# Patient Record
Sex: Male | Born: 1935 | Race: White | Hispanic: No | Marital: Married | Smoking: Never smoker
Health system: Southern US, Community
[De-identification: ages and names within clinical notes are randomized; demographics above are authoritative.]

## PROBLEM LIST (undated history)

## (undated) DIAGNOSIS — M1990 Unspecified osteoarthritis, unspecified site: Secondary | ICD-10-CM

## (undated) DIAGNOSIS — K219 Gastro-esophageal reflux disease without esophagitis: Secondary | ICD-10-CM

## (undated) DIAGNOSIS — S069X9A Unspecified intracranial injury with loss of consciousness of unspecified duration, initial encounter: Secondary | ICD-10-CM

## (undated) DIAGNOSIS — C801 Malignant (primary) neoplasm, unspecified: Secondary | ICD-10-CM

## (undated) DIAGNOSIS — I639 Cerebral infarction, unspecified: Secondary | ICD-10-CM

## (undated) HISTORY — DX: Gastro-esophageal reflux disease without esophagitis: K21.9

## (undated) HISTORY — DX: Unspecified intracranial injury with loss of consciousness of unspecified duration, initial encounter: S06.9X9A

## (undated) HISTORY — PX: FRACTURE SURGERY: SHX138

## (undated) HISTORY — DX: Malignant (primary) neoplasm, unspecified: C80.1

## (undated) HISTORY — PX: JOINT REPLACEMENT: SHX530

---

## 1982-03-29 HISTORY — PX: HIP PINNING: SHX1757

## 2002-03-29 DIAGNOSIS — C801 Malignant (primary) neoplasm, unspecified: Secondary | ICD-10-CM

## 2002-03-29 HISTORY — DX: Malignant (primary) neoplasm, unspecified: C80.1

## 2005-05-05 ENCOUNTER — Inpatient Hospital Stay: Payer: Self-pay | Admitting: Specialist

## 2005-05-05 ENCOUNTER — Other Ambulatory Visit: Payer: Self-pay

## 2005-05-08 ENCOUNTER — Emergency Department: Payer: Self-pay | Admitting: Emergency Medicine

## 2005-05-20 ENCOUNTER — Emergency Department: Payer: Self-pay | Admitting: Urology

## 2005-05-20 ENCOUNTER — Emergency Department: Payer: Self-pay | Admitting: Emergency Medicine

## 2005-05-21 ENCOUNTER — Other Ambulatory Visit: Payer: Self-pay

## 2005-05-21 ENCOUNTER — Ambulatory Visit: Payer: Self-pay | Admitting: Urology

## 2005-05-31 ENCOUNTER — Ambulatory Visit: Payer: Self-pay | Admitting: Urology

## 2005-09-23 ENCOUNTER — Ambulatory Visit: Payer: Self-pay | Admitting: Unknown Physician Specialty

## 2005-09-24 ENCOUNTER — Other Ambulatory Visit: Payer: Self-pay

## 2005-09-24 ENCOUNTER — Ambulatory Visit: Payer: Self-pay | Admitting: Unknown Physician Specialty

## 2005-09-27 ENCOUNTER — Ambulatory Visit: Payer: Self-pay | Admitting: Unknown Physician Specialty

## 2007-12-16 ENCOUNTER — Inpatient Hospital Stay: Payer: Self-pay | Admitting: Internal Medicine

## 2007-12-16 ENCOUNTER — Other Ambulatory Visit: Payer: Self-pay

## 2007-12-17 ENCOUNTER — Other Ambulatory Visit: Payer: Self-pay

## 2007-12-17 ENCOUNTER — Ambulatory Visit: Payer: Self-pay | Admitting: Internal Medicine

## 2011-03-10 ENCOUNTER — Emergency Department: Payer: Self-pay | Admitting: *Deleted

## 2012-03-29 HISTORY — PX: LEG SURGERY: SHX1003

## 2012-06-07 DIAGNOSIS — S069XAA Unspecified intracranial injury with loss of consciousness status unknown, initial encounter: Secondary | ICD-10-CM

## 2012-06-07 DIAGNOSIS — S069X9A Unspecified intracranial injury with loss of consciousness of unspecified duration, initial encounter: Secondary | ICD-10-CM

## 2012-06-07 HISTORY — DX: Unspecified intracranial injury with loss of consciousness of unspecified duration, initial encounter: S06.9X9A

## 2012-06-07 HISTORY — DX: Unspecified intracranial injury with loss of consciousness status unknown, initial encounter: S06.9XAA

## 2012-06-10 ENCOUNTER — Encounter: Payer: Self-pay | Admitting: General Surgery

## 2012-06-15 ENCOUNTER — Encounter: Payer: Self-pay | Admitting: Internal Medicine

## 2012-06-22 LAB — CBC WITH DIFFERENTIAL/PLATELET
Basophil #: 0.1 10*3/uL (ref 0.0–0.1)
Basophil %: 1 %
Eosinophil #: 0.6 10*3/uL (ref 0.0–0.7)
HGB: 8.2 g/dL — ABNORMAL LOW (ref 13.0–18.0)
Lymphocyte %: 21.7 %
MCHC: 32.4 g/dL (ref 32.0–36.0)
MCV: 85 fL (ref 80–100)
Monocyte #: 0.8 x10 3/mm (ref 0.2–1.0)
Neutrophil #: 7.5 10*3/uL — ABNORMAL HIGH (ref 1.4–6.5)
Neutrophil %: 65.7 %
Platelet: 623 10*3/uL — ABNORMAL HIGH (ref 150–440)
RBC: 2.98 10*6/uL — ABNORMAL LOW (ref 4.40–5.90)
RDW: 14.9 % — ABNORMAL HIGH (ref 11.5–14.5)

## 2012-06-22 LAB — COMPREHENSIVE METABOLIC PANEL
Alkaline Phosphatase: 197 U/L — ABNORMAL HIGH (ref 50–136)
Bilirubin,Total: 0.3 mg/dL (ref 0.2–1.0)
Calcium, Total: 8.4 mg/dL — ABNORMAL LOW (ref 8.5–10.1)
Creatinine: 1.58 mg/dL — ABNORMAL HIGH (ref 0.60–1.30)
EGFR (Non-African Amer.): 40 — ABNORMAL LOW
Glucose: 89 mg/dL (ref 65–99)
Osmolality: 286 (ref 275–301)
Potassium: 5 mmol/L (ref 3.5–5.1)
SGPT (ALT): 93 U/L — ABNORMAL HIGH (ref 12–78)
Sodium: 138 mmol/L (ref 136–145)
Total Protein: 6.8 g/dL (ref 6.4–8.2)

## 2012-06-23 LAB — CBC WITH DIFFERENTIAL/PLATELET
Basophil #: 0.1 10*3/uL (ref 0.0–0.1)
Basophil %: 0.8 %
Eosinophil #: 0.6 10*3/uL (ref 0.0–0.7)
Eosinophil %: 5.3 %
HGB: 8.8 g/dL — ABNORMAL LOW (ref 13.0–18.0)
Lymphocyte %: 20.9 %
MCH: 27.2 pg (ref 26.0–34.0)
MCHC: 32 g/dL (ref 32.0–36.0)
MCV: 85 fL (ref 80–100)
Monocyte #: 0.7 x10 3/mm (ref 0.2–1.0)
Monocyte %: 6.2 %
Neutrophil #: 7.7 10*3/uL — ABNORMAL HIGH (ref 1.4–6.5)
Neutrophil %: 66.8 %
Platelet: 684 10*3/uL — ABNORMAL HIGH (ref 150–440)
RBC: 3.23 10*6/uL — ABNORMAL LOW (ref 4.40–5.90)
WBC: 11.5 10*3/uL — ABNORMAL HIGH (ref 3.8–10.6)

## 2012-06-27 ENCOUNTER — Encounter: Payer: Self-pay | Admitting: Internal Medicine

## 2012-07-27 ENCOUNTER — Encounter: Payer: Self-pay | Admitting: Internal Medicine

## 2012-08-22 ENCOUNTER — Ambulatory Visit: Payer: Self-pay | Admitting: General Surgery

## 2012-08-27 ENCOUNTER — Encounter: Payer: Self-pay | Admitting: Internal Medicine

## 2012-08-30 ENCOUNTER — Encounter: Payer: Self-pay | Admitting: *Deleted

## 2012-09-14 LAB — CBC WITH DIFFERENTIAL/PLATELET
Basophil %: 0.6 %
Eosinophil %: 6.5 %
Lymphocyte #: 2.6 10*3/uL (ref 1.0–3.6)
MCH: 27.6 pg (ref 26.0–34.0)
MCHC: 32.9 g/dL (ref 32.0–36.0)
MCV: 84 fL (ref 80–100)
Monocyte #: 0.5 x10 3/mm (ref 0.2–1.0)
Monocyte %: 5.9 %
Neutrophil #: 5.4 10*3/uL (ref 1.4–6.5)
Neutrophil %: 58.5 %
RBC: 4.31 10*6/uL — ABNORMAL LOW (ref 4.40–5.90)

## 2012-09-14 LAB — BASIC METABOLIC PANEL
Anion Gap: 8 (ref 7–16)
BUN: 57 mg/dL — ABNORMAL HIGH (ref 7–18)
Calcium, Total: 9.2 mg/dL (ref 8.5–10.1)
Chloride: 114 mmol/L — ABNORMAL HIGH (ref 98–107)
Co2: 17 mmol/L — ABNORMAL LOW (ref 21–32)
Creatinine: 1.89 mg/dL — ABNORMAL HIGH (ref 0.60–1.30)
EGFR (African American): 37 — ABNORMAL LOW
EGFR (Non-African Amer.): 32 — ABNORMAL LOW
Glucose: 82 mg/dL (ref 65–99)
Osmolality: 292 (ref 275–301)
Potassium: 5.2 mmol/L — ABNORMAL HIGH (ref 3.5–5.1)
Sodium: 139 mmol/L (ref 136–145)

## 2012-09-14 LAB — MAGNESIUM: Magnesium: 1.5 mg/dL — ABNORMAL LOW

## 2012-10-30 ENCOUNTER — Encounter: Payer: Self-pay | Admitting: *Deleted

## 2012-11-06 ENCOUNTER — Encounter: Payer: Self-pay | Admitting: General Surgery

## 2012-11-06 ENCOUNTER — Ambulatory Visit (INDEPENDENT_AMBULATORY_CARE_PROVIDER_SITE_OTHER): Payer: Medicare Other | Admitting: General Surgery

## 2012-11-06 VITALS — BP 130/70 | HR 77 | Resp 14 | Ht 70.0 in | Wt 174.0 lb

## 2012-11-06 DIAGNOSIS — K219 Gastro-esophageal reflux disease without esophagitis: Secondary | ICD-10-CM

## 2012-11-06 DIAGNOSIS — R1310 Dysphagia, unspecified: Secondary | ICD-10-CM
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
## 2012-11-06 NOTE — Patient Instructions (Addendum)
Restart Omeprazole. Avoid meat for now. Call with progress report in one month.

## 2014-08-28 ENCOUNTER — Other Ambulatory Visit: Payer: Self-pay | Admitting: Urology

## 2014-08-28 DIAGNOSIS — R319 Hematuria, unspecified: Secondary | ICD-10-CM

## 2014-09-02 ENCOUNTER — Ambulatory Visit: Admission: RE | Admit: 2014-09-02 | Payer: Medicare Other | Source: Ambulatory Visit

## 2014-09-17 ENCOUNTER — Other Ambulatory Visit: Payer: Self-pay | Admitting: Urology

## 2014-09-17 DIAGNOSIS — C61 Malignant neoplasm of prostate: Secondary | ICD-10-CM

## 2014-09-24 ENCOUNTER — Ambulatory Visit
Admission: RE | Admit: 2014-09-24 | Discharge: 2014-09-24 | Disposition: A | Discharge status: Home / Self Care | Payer: Medicare Other | Source: Ambulatory Visit | Attending: Urology | Admitting: Urology

## 2014-09-24 ENCOUNTER — Other Ambulatory Visit: Payer: Self-pay | Admitting: Urology

## 2014-09-24 ENCOUNTER — Encounter
Admission: RE | Admit: 2014-09-24 | Discharge: 2014-09-24 | Disposition: A | Discharge status: Home / Self Care | Payer: Medicare Other | Source: Ambulatory Visit | Attending: Urology | Admitting: Urology

## 2014-09-24 DIAGNOSIS — N400 Enlarged prostate without lower urinary tract symptoms: Secondary | ICD-10-CM | POA: Insufficient documentation

## 2014-09-24 DIAGNOSIS — C61 Malignant neoplasm of prostate: Secondary | ICD-10-CM | POA: Diagnosis not present

## 2014-09-24 MED ORDER — TECHNETIUM TC 99M MEDRONATE IV KIT
25.0000 | PACK | Freq: Once | INTRAVENOUS | Status: AC | PRN
  Administered 2014-09-24: 23.24 via INTRAVENOUS

## 2014-10-22 ENCOUNTER — Encounter
Admission: RE | Admit: 2014-10-22 | Discharge: 2014-10-22 | Disposition: A | Discharge status: Home / Self Care | Payer: Medicare Other | Source: Ambulatory Visit | Attending: Urology | Admitting: Urology

## 2014-10-22 DIAGNOSIS — Z01810 Encounter for preprocedural cardiovascular examination: Secondary | ICD-10-CM | POA: Insufficient documentation

## 2014-10-22 DIAGNOSIS — Z01812 Encounter for preprocedural laboratory examination: Secondary | ICD-10-CM | POA: Diagnosis present

## 2014-10-22 HISTORY — DX: Unspecified osteoarthritis, unspecified site: M19.90

## 2014-10-22 LAB — BASIC METABOLIC PANEL
ANION GAP: 7 (ref 5–15)
BUN: 34 mg/dL — ABNORMAL HIGH (ref 6–20)
CALCIUM: 8.9 mg/dL (ref 8.9–10.3)
CO2: 21 mmol/L — ABNORMAL LOW (ref 22–32)
CREATININE: 2.23 mg/dL — AB (ref 0.61–1.24)
Chloride: 112 mmol/L — ABNORMAL HIGH (ref 101–111)
GFR calc non Af Amer: 25 mL/min — ABNORMAL LOW (ref >60)
GFR, EST AFRICAN AMERICAN: 29 mL/min — AB (ref >60)
Glucose, Bld: 100 mg/dL — ABNORMAL HIGH (ref 65–99)
Potassium: 4.9 mmol/L (ref 3.5–5.1)
Sodium: 140 mmol/L (ref 135–145)

## 2014-10-22 LAB — DIFFERENTIAL
BASOS PCT: 1 %
Basophils Absolute: 0.1 10*3/uL (ref 0–0.1)
EOS PCT: 3 %
Eosinophils Absolute: 0.2 10*3/uL (ref 0–0.7)
LYMPHS PCT: 23 %
Lymphs Abs: 1.9 10*3/uL (ref 1.0–3.6)
MONO ABS: 0.5 10*3/uL (ref 0.2–1.0)
Monocytes Relative: 6 %
NEUTROS PCT: 67 %
Neutro Abs: 5.4 10*3/uL (ref 1.4–6.5)

## 2014-10-22 LAB — CBC
HCT: 40.1 % (ref 40.0–52.0)
Hemoglobin: 13.1 g/dL (ref 13.0–18.0)
MCH: 27.7 pg (ref 26.0–34.0)
MCHC: 32.6 g/dL (ref 32.0–36.0)
MCV: 84.9 fL (ref 80.0–100.0)
PLATELETS: 256 10*3/uL (ref 150–440)
RBC: 4.73 MIL/uL (ref 4.40–5.90)
RDW: 14.6 % — AB (ref 11.5–14.5)
WBC: 8.1 10*3/uL (ref 3.8–10.6)

## 2014-10-22 MED ORDER — EPHEDRINE SULFATE 50 MG/ML IJ SOLN
INTRAMUSCULAR | Status: AC
  Filled 2014-10-22: qty 1

## 2014-10-22 NOTE — Patient Instructions (Signed)
  Your procedure is scheduled OM:BTDHRC 2, 2016 (Tuesday) Report to Day Surgery. To find out your arrival time please call [phone] between 1PM - 3PM on October 28, 2014 (Monday).  Remember: Instructions that are not followed completely may result in serious medical risk, up to and including death, or upon the discretion of your surgeon and anesthesiologist your surgery may need to be rescheduled.    __x__ 1. Do not eat food or drink liquids after midnight. No gum chewing or hard candies.     ____ 2. No Alcohol for 24 hours before or after surgery.   ____ 3. Bring all medications with you on the day of surgery if instructed.    __x_ 4. Notify your doctor if there is any change in your medical condition     (cold, fever, infections).     Do not wear jewelry, make-up, hairpins, clips or nail polish.  Do not wear lotions, powders, or perfumes. You may wear deodorant.  Do not shave 48 hours prior to surgery. Men may shave face and neck.  Do not bring valuables to the hospital.    [facility] is not responsible for any belongings or valuables.               Contacts, dentures or bridgework may not be worn into surgery.  Leave your suitcase in the car. After surgery it may be brought to your room.  For patients admitted to the hospital, discharge time is determined by your                treatment team.  Patients discharged the day of surgery will not be allowed to drive home.   Please read over the following fact sheets that you were given:     __x_ Take these medicines the morning of surgery with A SIP OF WATER:    1. Omeprazole (Omeorazole at bedtime on Monday night--August 1)  2.   3.   4.  5.  6.  ____ Fleet Enema (as directed)   ____ Use CHG Soap as directed  ____ Use inhalers on the day of surgery  ____ Stop metformin 2 days prior to surgery    ____ Take 1/2 of usual insulin dose the night before surgery and none on the morning of surgery.   ____ Stop  Coumadin/Plavix/aspirin on   ____ Stop Anti-inflammatories on    _X___ Stop supplements until after surgery. (Stop Vitamin B-12 until after surgery)  ____ Bring C-Pap to the hospital.

## 2014-10-23 NOTE — H&P (Deleted)
NAMEJOHNY, PITSTICK NO.:  [id number]  MEDICAL RECORD NO.:  [id number]  LOCATION:  PAT                          FACILITY:  [facility]  PHYSICIAN:  Maryan Puls          DATE OF BIRTH:  15-Nov-1926  DATE OF ADMISSION:  10/22/2014 DATE OF DISCHARGE:  10/22/2014                            HISTORY AND PHYSICAL   Same Day Surgery is scheduled on October 29, 2014.  CHIEF COMPLAINT:  Difficulty avoiding.  HISTORY OF PRESENT ILLNESS:  Mr. Siddoway is an 81 year old Caucasian male, with gross hematuria and difficulty voiding.  At the present time, he complains of weak urinary stream, frequency, urgency, and nocturia.  He was also found to have an elevated PSA of 44.14 ng/mL.  Ultrasound- guided needle biopsy of the prostate revealed a 97 g prostate with Gleason's grade 4+5 adenocarcinoma involving both sides of his prostate. Staging studies included bone scan and CT, which did not reveal any metastatic disease.  Cystoscopy on October 02, 2014 indicated lateral lobe prostatic hypertrophy with visual obstruction and a heavily trabeculated bladder.  Prostatic urethral length was 4 cm.  The patient comes in now for photovaporization of the prostate with a GreenLight laser.  PAST MEDICAL HISTORY:  The patient was allergies to morphine, which caused a rash, intolerant to Vioxx, which caused upset stomach, and prednisone, which caused GI bleeding.  CURRENT MEDICATIONS:  Included; omeprazole.  PREVIOUS SURGICAL PROCEDURES:  Included; reduction of a dislocated hip in 1982, repair of a fractured leg in 1983 and 1955, and left knee replacement in 2005.  SOCIAL HISTORY:  The patient denied tobacco or alcohol use.  FAMILY HISTORY:  Negative for prostate cancer.  PAST AND CURRENT MEDICAL CONDITIONS:  GERD.  REVIEW OF SYSTEMS:  The patient denied chest pain, shortness of breath, diabetes, stroke or hypertension.  PHYSICAL EXAMINATION:  GENERAL:  A well-nourished white male, in no acute  distress. HEENT:  Sclerae were clear. NECK:  Supple. LUNGS:  Clear to auscultation. CARDIOVASCULAR:  Regular rhythm and rate without audible murmurs. ABDOMEN:  Soft and nontender abdomen. GU:  Circumcised.  Testes atrophic 15 mL in size each. RECTAL:  Greater than 50 g nodular prostate. NEUROMUSCULAR:  Grossly intact.  IMPRESSION: 1. Bladder outlet obstruction. 2. Benign prostatic hypertrophy (BPH). 3. Prostate cancer.  PLAN:  Photovaporization of the prostate with a GreenLight laser.          ______________________________ Maryan Puls     MW/MEDQ  D:  10/23/2014  T:  10/23/2014  Job:  [id number]

## 2014-10-23 NOTE — H&P (Signed)
NAMESEGUNDO, Tyler Hale NO.:  [id number]  MEDICAL RECORD NO.:  [id number]  LOCATION:  [facility]                         FACILITY:  [facility]  PHYSICIAN:  Maryan Puls          DATE OF BIRTH:  Sep 08, 1926  DATE OF ADMISSION:  10/29/2014 DATE OF DISCHARGE:  10/29/2014                            HISTORY AND PHYSICAL   Same Day Surgery is scheduled on October 29, 2014.  CHIEF COMPLAINT:  Difficulty avoiding.  HISTORY OF PRESENT ILLNESS:  Tyler Hale is an 81 year old Caucasian male, with gross hematuria and difficulty voiding.  At the present time, he complains of weak urinary stream, frequency, urgency, and nocturia.  He was also found to have an elevated PSA of 44.14 ng/mL.  Ultrasound- guided needle biopsy of the prostate revealed a 97 g prostate with Gleason's grade 4+5 adenocarcinoma involving both sides of his prostate. Staging studies included bone scan and CT, which did not reveal any metastatic disease.  Cystoscopy on October 02, 2014 indicated lateral lobe prostatic hypertrophy with visual obstruction and a heavily trabeculated bladder.  Prostatic urethral length was 4 cm.  The patient comes in now for photovaporization of the prostate with a GreenLight laser.  PAST MEDICAL HISTORY:  The patient was allergies to morphine, which caused a rash, intolerant to Vioxx, which caused upset stomach, and prednisone, which caused GI bleeding.  CURRENT MEDICATIONS:  Included; omeprazole.  PREVIOUS SURGICAL PROCEDURES:  Included; reduction of a dislocated hip in 1982, repair of a fractured leg in 1983 and 1955, and left knee replacement in 2005.  SOCIAL HISTORY:  The patient denied tobacco or alcohol use.  FAMILY HISTORY:  Negative for prostate cancer.  PAST AND CURRENT MEDICAL CONDITIONS:  GERD.  REVIEW OF SYSTEMS:  The patient denied chest pain, shortness of breath, diabetes, stroke or hypertension.  PHYSICAL EXAMINATION:  GENERAL:  A well-nourished white male, in no acute  distress. HEENT:  Sclerae were clear. NECK:  Supple. LUNGS:  Clear to auscultation. CARDIOVASCULAR:  Regular rhythm and rate without audible murmurs. ABDOMEN:  Soft and nontender abdomen. GU:  Circumcised.  Testes atrophic 15 mL in size each. RECTAL:  Greater than 50 g nodular prostate. NEUROMUSCULAR:  Grossly intact.  IMPRESSION: 1. Bladder outlet obstruction. 2. Benign prostatic hypertrophy (BPH). 3. Prostate cancer.  PLAN:  Photovaporization of the prostate with a GreenLight laser.          ______________________________ Maryan Puls     MW/MEDQ  D:  10/23/2014  T:  10/23/2014  Job:  [id number]

## 2014-10-23 NOTE — Pre-Procedure Instructions (Signed)
EKG reviewed by Dr. Andree Elk and stated ok for surgery

## 2014-10-29 ENCOUNTER — Encounter: Payer: Self-pay | Admitting: Anesthesiology

## 2014-10-29 ENCOUNTER — Ambulatory Visit: Payer: Medicare Other | Admitting: Anesthesiology

## 2014-10-29 ENCOUNTER — Encounter: Payer: Self-pay | Admitting: Emergency Medicine

## 2014-10-29 ENCOUNTER — Ambulatory Visit
Admission: RE | Admit: 2014-10-29 | Discharge: 2014-10-29 | Disposition: A | Discharge status: Home / Self Care | Payer: Medicare Other | Source: Ambulatory Visit | Attending: Urology | Admitting: Urology

## 2014-10-29 ENCOUNTER — Inpatient Hospital Stay
Admission: EM | Admit: 2014-10-29 | Discharge: 2014-11-01 | DRG: 940 | Disposition: A | Discharge status: Home / Self Care | Payer: Medicare Other | Attending: Internal Medicine | Admitting: Internal Medicine

## 2014-10-29 ENCOUNTER — Emergency Department: Payer: Medicare Other

## 2014-10-29 ENCOUNTER — Encounter
Admission: RE | Disposition: A | Discharge status: Home / Self Care | Payer: Self-pay | Source: Ambulatory Visit | Attending: Urology

## 2014-10-29 DIAGNOSIS — N401 Enlarged prostate with lower urinary tract symptoms: Secondary | ICD-10-CM | POA: Diagnosis present

## 2014-10-29 DIAGNOSIS — N179 Acute kidney failure, unspecified: Secondary | ICD-10-CM | POA: Diagnosis present

## 2014-10-29 DIAGNOSIS — E875 Hyperkalemia: Secondary | ICD-10-CM | POA: Diagnosis present

## 2014-10-29 DIAGNOSIS — Z8551 Personal history of malignant neoplasm of bladder: Secondary | ICD-10-CM

## 2014-10-29 DIAGNOSIS — R310 Gross hematuria: Secondary | ICD-10-CM | POA: Diagnosis present

## 2014-10-29 DIAGNOSIS — R55 Syncope and collapse: Secondary | ICD-10-CM

## 2014-10-29 DIAGNOSIS — K219 Gastro-esophageal reflux disease without esophagitis: Secondary | ICD-10-CM | POA: Diagnosis present

## 2014-10-29 DIAGNOSIS — Z96659 Presence of unspecified artificial knee joint: Secondary | ICD-10-CM | POA: Diagnosis present

## 2014-10-29 DIAGNOSIS — N400 Enlarged prostate without lower urinary tract symptoms: Secondary | ICD-10-CM

## 2014-10-29 DIAGNOSIS — Z888 Allergy status to other drugs, medicaments and biological substances status: Secondary | ICD-10-CM

## 2014-10-29 DIAGNOSIS — R4182 Altered mental status, unspecified: Secondary | ICD-10-CM | POA: Diagnosis present

## 2014-10-29 DIAGNOSIS — Z800 Family history of malignant neoplasm of digestive organs: Secondary | ICD-10-CM

## 2014-10-29 DIAGNOSIS — Z87820 Personal history of traumatic brain injury: Secondary | ICD-10-CM

## 2014-10-29 DIAGNOSIS — R410 Disorientation, unspecified: Principal | ICD-10-CM | POA: Diagnosis present

## 2014-10-29 DIAGNOSIS — N138 Other obstructive and reflux uropathy: Secondary | ICD-10-CM | POA: Diagnosis present

## 2014-10-29 DIAGNOSIS — F0280 Dementia in other diseases classified elsewhere without behavioral disturbance: Secondary | ICD-10-CM | POA: Diagnosis present

## 2014-10-29 DIAGNOSIS — C61 Malignant neoplasm of prostate: Secondary | ICD-10-CM | POA: Diagnosis present

## 2014-10-29 DIAGNOSIS — Z8042 Family history of malignant neoplasm of prostate: Secondary | ICD-10-CM

## 2014-10-29 DIAGNOSIS — R569 Unspecified convulsions: Secondary | ICD-10-CM | POA: Diagnosis present

## 2014-10-29 DIAGNOSIS — N189 Chronic kidney disease, unspecified: Secondary | ICD-10-CM | POA: Diagnosis present

## 2014-10-29 HISTORY — PX: GREEN LIGHT LASER TURP (TRANSURETHRAL RESECTION OF PROSTATE: SHX6260

## 2014-10-29 LAB — CBC
HCT: 38.4 % — ABNORMAL LOW (ref 40.0–52.0)
Hemoglobin: 12.6 g/dL — ABNORMAL LOW (ref 13.0–18.0)
MCH: 27.9 pg (ref 26.0–34.0)
MCHC: 32.7 g/dL (ref 32.0–36.0)
MCV: 85.2 fL (ref 80.0–100.0)
PLATELETS: 239 10*3/uL (ref 150–440)
RBC: 4.51 MIL/uL (ref 4.40–5.90)
RDW: 14.4 % (ref 11.5–14.5)
WBC: 12.2 10*3/uL — ABNORMAL HIGH (ref 3.8–10.6)

## 2014-10-29 LAB — COMPREHENSIVE METABOLIC PANEL
ALK PHOS: 80 U/L (ref 38–126)
ALT: 9 U/L — ABNORMAL LOW (ref 17–63)
ANION GAP: 6 (ref 5–15)
AST: 19 U/L (ref 15–41)
Albumin: 3.4 g/dL — ABNORMAL LOW (ref 3.5–5.0)
BUN: 35 mg/dL — ABNORMAL HIGH (ref 6–20)
CO2: 18 mmol/L — ABNORMAL LOW (ref 22–32)
Calcium: 8.4 mg/dL — ABNORMAL LOW (ref 8.9–10.3)
Chloride: 116 mmol/L — ABNORMAL HIGH (ref 101–111)
Creatinine, Ser: 2.06 mg/dL — ABNORMAL HIGH (ref 0.61–1.24)
GFR, EST AFRICAN AMERICAN: 32 mL/min — AB (ref >60)
GFR, EST NON AFRICAN AMERICAN: 28 mL/min — AB (ref >60)
Glucose, Bld: 126 mg/dL — ABNORMAL HIGH (ref 65–99)
Potassium: 4.5 mmol/L (ref 3.5–5.1)
Sodium: 140 mmol/L (ref 135–145)
Total Bilirubin: 0.6 mg/dL (ref 0.3–1.2)
Total Protein: 6.5 g/dL (ref 6.5–8.1)

## 2014-10-29 LAB — GLUCOSE, CAPILLARY: Glucose-Capillary: 113 mg/dL — ABNORMAL HIGH (ref 65–99)

## 2014-10-29 LAB — TROPONIN I: Troponin I: <0.03 ng/mL (ref <0.031)

## 2014-10-29 SURGERY — GREEN LIGHT LASER TURP (TRANSURETHRAL RESECTION OF PROSTATE
Anesthesia: General | Wound class: Clean Contaminated

## 2014-10-29 MED ORDER — LIDOCAINE HCL 2 % EX GEL
CUTANEOUS | Status: AC
  Filled 2014-10-29: qty 10

## 2014-10-29 MED ORDER — LIDOCAINE HCL (CARDIAC) 20 MG/ML IV SOLN
INTRAVENOUS | Status: DC | PRN
  Administered 2014-10-29: 40 mg via INTRAVENOUS

## 2014-10-29 MED ORDER — OXYCODONE HCL 5 MG/5ML PO SOLN: 5.0000 mg | Freq: Once | ORAL | Status: AC | PRN

## 2014-10-29 MED ORDER — PHENYLEPHRINE HCL 10 MG/ML IJ SOLN
INTRAMUSCULAR | Status: DC | PRN
  Administered 2014-10-29 (×2): 50 ug via INTRAVENOUS

## 2014-10-29 MED ORDER — FENTANYL CITRATE (PF) 100 MCG/2ML IJ SOLN: 25.0000 ug | INTRAMUSCULAR | Status: DC | PRN

## 2014-10-29 MED ORDER — ROCURONIUM BROMIDE 100 MG/10ML IV SOLN
INTRAVENOUS | Status: DC | PRN
  Administered 2014-10-29: 5 mg via INTRAVENOUS

## 2014-10-29 MED ORDER — PROPOFOL 10 MG/ML IV BOLUS
INTRAVENOUS | Status: DC | PRN
  Administered 2014-10-29: 30 mg via INTRAVENOUS
  Administered 2014-10-29: 130 mg via INTRAVENOUS
  Administered 2014-10-29 (×2): 30 mg via INTRAVENOUS

## 2014-10-29 MED ORDER — ONDANSETRON HCL 4 MG/2ML IJ SOLN: 4.0000 mg | Freq: Once | INTRAMUSCULAR | Status: DC | PRN

## 2014-10-29 MED ORDER — OXYCODONE HCL 5 MG PO TABS
5.0000 mg | ORAL_TABLET | Freq: Once | ORAL | Status: AC | PRN
  Administered 2014-10-29: 5 mg via ORAL

## 2014-10-29 MED ORDER — BELLADONNA ALKALOIDS-OPIUM 16.2-60 MG RE SUPP
RECTAL | Status: DC | PRN
  Administered 2014-10-29: 1 via RECTAL

## 2014-10-29 MED ORDER — URIBEL 118 MG PO CAPS: 1.0000 | ORAL_CAPSULE | Freq: Four times a day (QID) | ORAL | Status: DC | PRN

## 2014-10-29 MED ORDER — ONDANSETRON 8 MG PO TBDP: 8.0000 mg | ORAL_TABLET | Freq: Four times a day (QID) | ORAL | Status: DC | PRN

## 2014-10-29 MED ORDER — BELLADONNA ALKALOIDS-OPIUM 16.2-60 MG RE SUPP
RECTAL | Status: AC
  Filled 2014-10-29: qty 1

## 2014-10-29 MED ORDER — FENTANYL CITRATE (PF) 100 MCG/2ML IJ SOLN
INTRAMUSCULAR | Status: DC | PRN
  Administered 2014-10-29: 50 ug via INTRAVENOUS

## 2014-10-29 MED ORDER — LEVOFLOXACIN IN D5W 500 MG/100ML IV SOLN
INTRAVENOUS | Status: AC
  Administered 2014-10-29: 500 mg via INTRAVENOUS
  Filled 2014-10-29: qty 100

## 2014-10-29 MED ORDER — DOCUSATE SODIUM 100 MG PO CAPS: 200.0000 mg | ORAL_CAPSULE | Freq: Two times a day (BID) | ORAL | Status: DC

## 2014-10-29 MED ORDER — LEVOFLOXACIN IN D5W 500 MG/100ML IV SOLN
500.0000 mg | Freq: Once | INTRAVENOUS | Status: AC
  Administered 2014-10-29: 500 mg via INTRAVENOUS

## 2014-10-29 MED ORDER — OXYCODONE HCL 5 MG PO TABS
ORAL_TABLET | ORAL | Status: AC
  Filled 2014-10-29: qty 1

## 2014-10-29 MED ORDER — LACTATED RINGERS IV SOLN
INTRAVENOUS | Status: DC
  Administered 2014-10-29 (×2): via INTRAVENOUS

## 2014-10-29 MED ORDER — SUCCINYLCHOLINE CHLORIDE 20 MG/ML IJ SOLN
INTRAMUSCULAR | Status: DC | PRN
  Administered 2014-10-29: 100 mg via INTRAVENOUS

## 2014-10-29 MED ORDER — ONDANSETRON HCL 4 MG/2ML IJ SOLN
INTRAMUSCULAR | Status: DC | PRN
  Administered 2014-10-29: 4 mg via INTRAVENOUS

## 2014-10-29 MED ORDER — LEVOFLOXACIN 500 MG PO TABS: 500.0000 mg | ORAL_TABLET | Freq: Every day | ORAL | Status: DC

## 2014-10-29 MED ORDER — ACETAMINOPHEN-CODEINE #3 300-30 MG PO TABS: 1.0000 | ORAL_TABLET | ORAL | Status: DC | PRN

## 2014-10-29 SURGICAL SUPPLY — 29 items
ADAPTER IRRIG TUBE 2 SPIKE SOL (ADAPTER) ×6 IMPLANT
BAG URO DRAIN 2000ML W/SPOUT (MISCELLANEOUS) ×3 IMPLANT
CATH FOLEY 2WAY  5CC 20FR SIL (CATHETERS) ×2
CATH FOLEY 2WAY 5CC 20FR SIL (CATHETERS) ×1 IMPLANT
GLOVE BIO SURGEON STRL SZ7 (GLOVE) ×9 IMPLANT
GLOVE BIO SURGEON STRL SZ7.5 (GLOVE) ×3 IMPLANT
GOWN STRL REUS W/ TWL LRG LVL3 (GOWN DISPOSABLE) ×2 IMPLANT
GOWN STRL REUS W/ TWL XL LVL3 (GOWN DISPOSABLE) ×1 IMPLANT
GOWN STRL REUS W/TWL LRG LVL3 (GOWN DISPOSABLE) ×4
GOWN STRL REUS W/TWL XL LVL3 (GOWN DISPOSABLE) ×2
IV NS 1000ML (IV SOLUTION) ×2
IV NS 1000ML BAXH (IV SOLUTION) ×1 IMPLANT
IV SET PRIMARY 15D 139IN B9900 (IV SETS) ×3 IMPLANT
JELLY LUB 2OZ STRL (MISCELLANEOUS) ×2
JELLY LUBE 2OZ STRL (MISCELLANEOUS) ×1 IMPLANT
KIT RM TURNOVER CYSTO AR (KITS) ×3 IMPLANT
LASER GRNLGT 950 (MISCELLANEOUS) ×3 IMPLANT
LASER GRNLGT MOXY FIBER 750UM (MISCELLANEOUS) ×3 IMPLANT
PACK CYSTO AR (MISCELLANEOUS) ×3 IMPLANT
PREP PVP WINGED SPONGE (MISCELLANEOUS) ×3 IMPLANT
SET IRRIG Y TYPE TUR BLADDER L (SET/KITS/TRAYS/PACK) ×3 IMPLANT
SOL .9 NS 3000ML IRR  AL (IV SOLUTION) ×18
SOL .9 NS 3000ML IRR UROMATIC (IV SOLUTION) ×9 IMPLANT
SOL PREP PVP 2OZ (MISCELLANEOUS) ×3
SOLUTION PREP PVP 2OZ (MISCELLANEOUS) ×1 IMPLANT
SYRINGE IRR TOOMEY STRL 70CC (SYRINGE) ×3 IMPLANT
TUBING CONNECTING 10 (TUBING) ×2 IMPLANT
TUBING CONNECTING 10' (TUBING) ×1
WATER STERILE IRR 1000ML POUR (IV SOLUTION) ×3 IMPLANT

## 2014-10-29 NOTE — ED Notes (Signed)
Pt presents to ED via POV with c/o of altered mental status, weakness, and confusion. Pt arrived to ER POV with spouse alongside patient. Pt had to be transported from vehicle to stretcher manually due to pt presenting sx. Pt notably diaphoretic and unable to respond to questions/commands appropriately. Pt spouse informed ER staff pt was recently d/c from [facility] from a prostate surgery being completed today. ER MD at bedside, completing medical evaluation.

## 2014-10-29 NOTE — Consult Note (Signed)
Date of Initial H&P: 10/22/14  History reviewed, patient examined, no change in status, stable for surgery.

## 2014-10-29 NOTE — Transfer of Care (Signed)
Immediate Anesthesia Transfer of Care Note  Patient: Tyler Hale  Procedure(s) Performed: Procedure(s): GREEN LIGHT LASER TURP (TRANSURETHRAL RESECTION OF PROSTATE (N/A)  Patient Location: PACU  Anesthesia Type:General  Level of Consciousness: awake and patient cooperative  Airway & Oxygen Therapy: Patient Spontanous Breathing  Post-op Assessment: Report given to RN and Post -op Vital signs reviewed and stable  Post vital signs: Reviewed and stable  Last Vitals:  Filed Vitals:   10/29/14 1803  BP: 145/73  Pulse: 61  Temp: 36.1 C  Resp: 16    Complications: No apparent anesthesia complications

## 2014-10-29 NOTE — Discharge Instructions (Addendum)
Benign Prostatic Hyperplasia An enlarged prostate (benign prostatic hyperplasia) is common in older men. You may experience the following:  Weak urine stream.  Dribbling.  Feeling like the bladder has not emptied completely.  Difficulty starting urination.  Getting up frequently at night to urinate.  Urinating more frequently during the day. HOME CARE INSTRUCTIONS  Monitor your prostatic hyperplasia for any changes. The following actions may help to alleviate any discomfort you are experiencing:  Give yourself time when you urinate.  Stay away from alcohol.  Avoid beverages containing caffeine, such as coffee, tea, and colas, because they can make the problem worse.  Avoid decongestants, antihistamines, and some prescription medicines that can make the problem worse.  Follow up with your health care provider for further treatment as recommended. SEEK MEDICAL CARE IF:  You are experiencing progressive difficulty voiding.  Your urine stream is progressively getting narrower.  You are awaking from sleep with the urge to void more frequently.  You are constantly feeling the need to void.  You experience loss of urine, especially in small amounts. SEEK IMMEDIATE MEDICAL CARE IF:   You develop increased pain with urination or are unable to urinate.  You develop severe abdominal pain, vomiting, a high fever, or fainting.  You develop back pain or blood in your urine. MAKE SURE YOU:   Understand these instructions.  Will watch your condition.  Will get help right away if you are not doing well or get worse. Document Released: 03/15/2005 Document Revised: 11/15/2012 Document Reviewed: 08/15/2012 [facility] Patient Information 2015 [place], [place]. This information is not intended to replace advice given to you by your health care provider. Make sure you discuss any questions you have with your health care provider.  Prostate Laser Surgery Prostate laser surgery is a  procedure to eliminate prostate tissue. There are two types of prostate laser surgery: ablation (prostate tissue is melted away) and enucleation (prostate tissue is cut out). LET [facility] CARE PROVIDER KNOW ABOUT:  Any allergies you have.  All medicines you are taking, including vitamins, herbs, eye drops, creams, and over-the-counter medicines.  Previous problems you or members of your family have had with the use of anesthetics.  Any blood disorders you have.  Previous surgeries you have had.  Medical conditions you have. RISKS AND COMPLICATIONS  Generally prostate laser surgery is a safe procedure. However, as with any procedure, problems can occur. Possible problems include:  Bleeding and the need for a blood transfusion.   Urinary tract infection.  Erectile dysfunction.  Narrowing (scar or stricture) of the urethra, which blocks the flow of urine.  Dry ejaculation (semen is not released when you reach sexual climax). BEFORE THE PROCEDURE   If you are on blood thinners, such as warfarin or clopidogrel, or nonprescription pain-relieving medicines, such as naproxen sodium or ibuprofen, you may be asked to stop taking them before the procedure.  Your health care provider may ask you to start taking antibiotic medicines before the procedure as a precaution against a bacterial infection. The procedure will not be performed if your urine is infected.  You should have nothing to eat or drink for at least 8 hours before your procedure, or as suggested by your health care provider. You may have a sip of water to take medications not stopped for the procedure. PROCEDURE  You will be given one of the following:   A medicine that numbs the area (local anesthetic).  A medicine injected into your spine that numbs your body  below the waist (spinal anesthetic). A sedative is usually given with spinal anesthetic so you will be relaxed during the procedure. A viewing scope and  instruments will be placed in a tube that is inserted through your penis, so no incisions will be needed to insert the scope and instruments. Depending on the type of laser used, the prostate tissue will either be vaporized or cut away. The laser beam will coagulate any small bleeding areas. At the end of the surgery, a special tube will be inserted into your bladder to drain the urine from your bladder (urinary catheter). AFTER THE PROCEDURE You will be sent to the recovery room for a short time. In the recovery room, you will receive fluids through an IV tube inserted in one of your veins. Your blood pressure and pulse will be checked frequently to make sure that they stabilize. Once you are eating and drinking fluids appropriately, the IV tube will be removed.  Depending on your specific needs, you may be admitted to the hospital or you will be sent home after the procedure. If you are sent home:  You may be sent home with elastic support stockings to help prevent blood clots in your legs.  You will also probably be given an antibiotic medicine.  Unless told otherwise, you may restart your other medications.  You may be given a stool softener. Document Released: 03/15/2005 Document Revised: 03/20/2013 Document Reviewed: 09/04/2012 [facility] Patient Information 2015 [place], [place]. This information is not intended to replace advice given to you by your health care provider. Make sure you discuss any questions you have with your health care provider. AMBULATORY SURGERY  DISCHARGE INSTRUCTIONS   1) The drugs that you were given will stay in your system until tomorrow so for the next 24 hours you should not:  A) Drive an automobile B) Make any legal decisions C) Drink any alcoholic beverage   2) You may resume regular meals tomorrow.  Today it is better to start with liquids and gradually work up to solid foods.  You may eat anything you prefer, but it is better to start with liquids, then  soup and crackers, and gradually work up to solid foods.   3) Please notify your doctor immediately if you have any unusual bleeding, trouble breathing, redness and pain at the surgery site, drainage, fever, or pain not relieved by medication.    4) Additional Instructions:   Please contact your physician with any problems or Same Day Surgery at [phone], Monday through Friday 6 am to 4 pm, or [place] at [facility] number at [phone].
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
## 2014-10-29 NOTE — Anesthesia Procedure Notes (Signed)
Procedure Name: Intubation Date/Time: 10/29/2014 4:15 PM Performed by: Dionne Bucy Pre-anesthesia Checklist: Patient identified, Patient being monitored, Timeout performed, Emergency Drugs available and Suction available Patient Re-evaluated:Patient Re-evaluated prior to inductionOxygen Delivery Method: Circle system utilized Preoxygenation: Pre-oxygenation with 100% oxygen Intubation Type: IV induction, Rapid sequence and Cricoid Pressure applied Ventilation: Mask ventilation without difficulty Laryngoscope Size: Mac and 4 Grade View: Grade I Tube type: Oral Tube size: 7.5 mm Number of attempts: 1 Airway Equipment and Method: Stylet Placement Confirmation: ETT inserted through vocal cords under direct vision,  positive ETCO2 and breath sounds checked- equal and bilateral Secured at: 21 cm Tube secured with: Tape Dental Injury: Teeth and Oropharynx as per pre-operative assessment

## 2014-10-29 NOTE — OR Nursing (Signed)
Patient from or with no documentation of foley.  Catheter intact and secured to leg with strap.  Some bloody drainage from meatus on arrival.  Patient with dementia and pulling at the catheter.

## 2014-10-29 NOTE — Anesthesia Postprocedure Evaluation (Signed)
  Anesthesia Post-op Note  Patient: Tyler Hale  Procedure(s) Performed: Procedure(s): GREEN LIGHT LASER TURP (TRANSURETHRAL RESECTION OF PROSTATE (N/A)  Anesthesia type:General  Patient location: PACU  Post pain: Pain level controlled  Post assessment: Post-op Vital signs reviewed, Patient's Cardiovascular Status Stable, Respiratory Function Stable, Patent Airway and No signs of Nausea or vomiting  Post vital signs: Reviewed and stable  Last Vitals:  Filed Vitals:   10/29/14 1850  BP: 179/86  Pulse: 82  Temp: 36.1 C  Resp: 16    Level of consciousness: awake, alert  and patient cooperative  Complications: No apparent anesthesia complications

## 2014-10-29 NOTE — ED Provider Notes (Signed)
[facility] Emergency Department Provider Note  ____________________________________________  Time seen:seen upon arrival to the emergency department  I have reviewed the triage vital signs and the nursing notes.   HISTORY  Chief Complaint Altered Mental Status; Weakness; and Post-op Problem    HPI Tyler Hale is a 81 y.o. male with a history of prostate cancer and dementia who presents today one hour after being discharged from the hospital with an episode of unresponsiveness. per the family, they had just finished eating and were in their truck when the patient became unresponsive and diaphoretic. The patient also vomited several times. He was unresponsive for a total of about 2-1/2 minutes. His son said that he appeared to have his eyes rolling back in his head.  The family rushed him back to the hospital which point he was taken from his truck on a stretcher to the examination room. In the examination room, he became more responsive and returned to his baseline mental status. The patient is not complaining of any pain at this time.   Past Medical History  Diagnosis Date  . GERD (gastroesophageal reflux disease)   . 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 Cancer 2004    bladder  . Cancer     prostate  . Arthritis     Patient Active Problem List   Diagnosis Date Noted  . Esophageal reflux 11/06/2012    Past Surgical History  Procedure Laterality Date  . Leg surgery Bilateral 2014  . Joint replacement Left     Total knee Replacement  . Hip pinning Left 1984    hit by tractor  . Fracture surgery Left     hip    Current Outpatient Rx  Name  Route  Sig  Dispense  Refill  . acetaminophen-codeine (TYLENOL #3) 300-30 MG per tablet   Oral   Take 1-2 tablets by mouth every 4 (four) hours as needed for moderate pain.   30 tablet   2   . docusate sodium (COLACE) 100 MG capsule   Oral   Take 2 capsules (200 mg total) by mouth 2 (two) times daily.   120  capsule   3   . levofloxacin (LEVAQUIN) 500 MG tablet   Oral   Take 1 tablet (500 mg total) by mouth daily.   7 tablet   0   . Meth-Hyo-M Bl-Na Phos-Ph Sal (URIBEL) 118 MG CAPS   Oral   Take 1 capsule (118 mg total) by mouth every 6 (six) hours as needed (dysuria).   40 capsule   3     Dispense as written.   Marland Kitchen omeprazole (PRILOSEC) 20 MG capsule   Oral   Take 20 mg by mouth daily.         . ondansetron (ZOFRAN ODT) 8 MG disintegrating tablet   Oral   Take 1 tablet (8 mg total) by mouth every 6 (six) hours as needed for nausea or vomiting.   10 tablet   3   . vitamin B-12 (CYANOCOBALAMIN) 1000 MCG tablet   Oral   Take 1,000 mcg by mouth daily.           Allergies Prednisone and Vioxx  No family history on file.  Social History History  Substance Use Topics  . Smoking status: Never Smoker   . Smokeless tobacco: Never Used  . Alcohol Use: No    Review of Systems Constitutional: No fever/chills Eyes: No visual changes. ENT: No sore throat. Cardiovascular: Denies chest pain. Respiratory:  Denies shortness of breath. Gastrointestinal: No abdominal pain.  No nausea, no vomiting.  No diarrhea.  No constipation. Genitourinary: Negative for dysuria. Musculoskeletal: Negative for back pain. Skin: Negative for rash. Neurological: Negative for headaches, focal weakness or numbness.  10-point ROS otherwise negative.  ____________________________________________   PHYSICAL EXAM:  VITAL SIGNS: ED Triage Vitals  Enc Vitals Group     BP 10/29/14 2106 131/111 mmHg     Pulse Rate 10/29/14 2106 69     Resp 10/29/14 2106 29     Temp 10/29/14 2106 97.7 F (36.5 C)     Temp Source 10/29/14 2106 Oral     SpO2 10/29/14 2106 95 %     Weight 10/29/14 2106 188 lb (85.276 kg)     Height 10/29/14 2125 5\' 10"  (1.778 m)     Head Cir --      Peak Flow --      Pain Score --      Pain Loc --      Pain Edu? --      Excl. in [place]? --     Constitutional: Alert and  oriented. Well appearing and in no acute distress. Eyes: Conjunctivae are normal. PERRL. EOMI. Head: Atraumatic. Nose: No congestion/rhinnorhea. Mouth/Throat: Mucous membranes are moist.  Oropharynx non-erythematous. Neck: No stridor.   Cardiovascular: Normal rate, regular rhythm. Grossly normal heart sounds.  Good peripheral circulation. Respiratory: Normal respiratory effort.  No retractions. Lungs CTAB. Gastrointestinal: Soft and nontender. No distention. No abdominal bruits. No CVA tenderness. Genitourinary: Foley catheter in place with blood tinged urine draining. Musculoskeletal: No lower extremity tenderness nor edema.  No joint effusions. Neurologic:  Normal speech and language. No gross focal neurologic deficits are appreciated. No gait instability. Skin:  Skin is warm, dry and intact. No rash noted. Psychiatric: Mood and affect are normal. Speech and behavior are normal.  ____________________________________________   LABS (all labs ordered are listed, but only abnormal results are displayed)  Labs Reviewed  GLUCOSE, CAPILLARY - Abnormal; Notable for the following:    Glucose-Capillary 113 (*)    All other components within normal limits  COMPREHENSIVE METABOLIC PANEL - Abnormal; Notable for the following:    Chloride 116 (*)    CO2 18 (*)    Glucose, Bld 126 (*)    BUN 35 (*)    Creatinine, Ser 2.06 (*)    Calcium 8.4 (*)    Albumin 3.4 (*)    ALT 9 (*)    GFR calc non Af Amer 28 (*)    GFR calc Af Amer 32 (*)    All other components within normal limits  CBC - Abnormal; Notable for the following:    WBC 12.2 (*)    Hemoglobin 12.6 (*)    HCT 38.4 (*)    All other components within normal limits  CULTURE, BLOOD (ROUTINE X 2)  CULTURE, BLOOD (ROUTINE X 2)  URINE CULTURE  TROPONIN I  URINALYSIS COMPLETEWITH MICROSCOPIC ([facility] ONLY)  CBG MONITORING, ED   ____________________________________________  EKG  ED ECG REPORT I, Doran Stabler, the attending  physician, personally viewed and interpreted this ECG.   Date: 10/29/2014  EKG Time: 2109  Rate: 68  Rhythm: sinus rhythm with marked sinus arrhythmia. Possibly second-degree heart block type I..  Axis: normal axis  Intervals:second-degree A-V block, ( Mobitz I )right bundle blanch block.  ST&T Change: no ST elevations or depressions. No abnormal T-wave inversions.  __old EKG from 10/22/2014 reviewed. Unchanged right bundle branch block.  However, no dropped beats on previous EKG.___________________________  RADIOLOGY  No edema or consolidation on the chest x-ray. I personally reviewed these images. ____________________________________________   PROCEDURES    ____________________________________________   INITIAL IMPRESSION / ASSESSMENT AND PLAN / ED COURSE  Pertinent labs & imaging results that were available during my care of the patient were reviewed by me and considered in my medical decision making (see chart for details).  ----------------------------------------- 10:44 PM on 10/29/2014 -----------------------------------------  Discussed case with Dr. Rogers Blocker who does not think that the patient's episode was from a surgical complication.  ----------------------------------------- 11:12 PM on 10/29/2014 -----------------------------------------  We will keep the patient overnight in the hospital for observation. Possible syncopal episode due to arrhythmia. Signed out to Dr. Lavetta Nielsen. Pancultured. ____________________________________________   FINAL CLINICAL IMPRESSION(S) / ED DIAGNOSES  Syncopal episode. Initial visit.    Orbie Pyo, MD 10/29/14 [phone]

## 2014-10-29 NOTE — Op Note (Signed)
Preoperative diagnosis: 1. BPH with bladder outlet obstruction                                            2. Prostate cancer Postoperative diagnosis: Same  Procedure: Photovaporization of the prostate with greenlight laser   Surgeon: Otelia Limes. Yves Dill MD, FACS Anesthesia: Gen.  Indications:See the history and physical. After informed consent the above procedure(s) were requested     Technique and findings: After adequate general anesthesia been obtained the patient was placed into dorsal lithotomy position and the perineum was prepped and draped in the usual fashion. The laser scope was coupled to the camera and then visually advanced into the bladder. Patient was noted have trilobar BPH with visual obstruction. Bladder was moderately trabeculated. No bladder tumors were identified. Both ureteral orifices were identified and had clear reflux. At this point the XPS greenlight laser fiber was introduced through the scope and set at 37 W. Bladder neck tissue and median lobe was vaporized. Power was increased to 120 W and remaining obstructive tissue was vaporized up to the verumontanum. Finally the power was increased to 180 W and remaining obstructive tissue vaporized. The the laser scope was then removed and 10 cc of viscous Xylocaine instilled within the urethra and the bladder. A 20 French silicone catheter was placed and irrigated until clear. A B&O suppository was placed. The procedure was then terminated and the patient was transferred to the recovery room in stable condition.

## 2014-10-30 ENCOUNTER — Encounter: Payer: Self-pay | Admitting: Internal Medicine

## 2014-10-30 DIAGNOSIS — R410 Disorientation, unspecified: Secondary | ICD-10-CM | POA: Diagnosis present

## 2014-10-30 LAB — URINALYSIS COMPLETE WITH MICROSCOPIC (ARMC ONLY)
Bilirubin Urine: NEGATIVE
Glucose, UA: NEGATIVE mg/dL
Ketones, ur: NEGATIVE mg/dL
Leukocytes, UA: NEGATIVE
Nitrite: NEGATIVE
Protein, ur: 100 mg/dL — AB
Specific Gravity, Urine: 1.011 (ref 1.005–1.030)
Squamous Epithelial / LPF: NONE SEEN
pH: 6 (ref 5.0–8.0)

## 2014-10-30 LAB — TSH: TSH: 8.886 u[IU]/mL — ABNORMAL HIGH (ref 0.350–4.500)

## 2014-10-30 LAB — HEMOGLOBIN A1C: Hgb A1c MFr Bld: 6 % (ref 4.0–6.0)

## 2014-10-30 LAB — TROPONIN I: Troponin I: 0.03 ng/mL (ref <0.031)

## 2014-10-30 MED ORDER — SODIUM CHLORIDE 0.9 % IV SOLN
INTRAVENOUS | Status: DC
  Administered 2014-10-30: 02:00:00 via INTRAVENOUS

## 2014-10-30 MED ORDER — MORPHINE SULFATE 2 MG/ML IJ SOLN
1.0000 mg | INTRAMUSCULAR | Status: DC | PRN
  Filled 2014-10-30: qty 1

## 2014-10-30 MED ORDER — DOCUSATE SODIUM 100 MG PO CAPS
100.0000 mg | ORAL_CAPSULE | Freq: Two times a day (BID) | ORAL | Status: DC
Start: 2014-10-30 — End: 2014-10-30

## 2014-10-30 MED ORDER — LEVOFLOXACIN 500 MG PO TABS: 500.0000 mg | ORAL_TABLET | Freq: Every day | ORAL | Status: DC

## 2014-10-30 MED ORDER — URELLE 81 MG PO TABS
1.0000 | ORAL_TABLET | Freq: Four times a day (QID) | ORAL | Status: DC | PRN
  Filled 2014-10-30: qty 1

## 2014-10-30 MED ORDER — SODIUM CHLORIDE 0.9 % IV BOLUS (SEPSIS)
1000.0000 mL | Freq: Once | INTRAVENOUS | Status: AC
  Administered 2014-10-30: 1000 mL via INTRAVENOUS

## 2014-10-30 MED ORDER — DOCUSATE SODIUM 100 MG PO CAPS
200.0000 mg | ORAL_CAPSULE | Freq: Two times a day (BID) | ORAL | Status: DC
  Administered 2014-10-30 – 2014-10-31 (×5): 200 mg via ORAL
  Filled 2014-10-30 (×5): qty 2

## 2014-10-30 MED ORDER — ONDANSETRON 8 MG PO TBDP
8.0000 mg | ORAL_TABLET | Freq: Four times a day (QID) | ORAL | Status: DC | PRN
  Administered 2014-10-30: 8 mg via ORAL
  Filled 2014-10-30 (×2): qty 1

## 2014-10-30 MED ORDER — URIBEL 118 MG PO CAPS: 1.0000 | ORAL_CAPSULE | Freq: Four times a day (QID) | ORAL | Status: DC | PRN

## 2014-10-30 MED ORDER — LEVOFLOXACIN 250 MG PO TABS
250.0000 mg | ORAL_TABLET | Freq: Every day | ORAL | Status: DC
  Administered 2014-10-30 – 2014-10-31 (×2): 250 mg via ORAL
  Filled 2014-10-30 (×3): qty 1

## 2014-10-30 MED ORDER — HEPARIN SODIUM (PORCINE) 5000 UNIT/ML IJ SOLN
5000.0000 [IU] | Freq: Three times a day (TID) | INTRAMUSCULAR | Status: DC
  Administered 2014-10-30: 5000 [IU] via SUBCUTANEOUS
  Filled 2014-10-30: qty 1

## 2014-10-30 MED ORDER — LEVOFLOXACIN 500 MG PO TABS
500.0000 mg | ORAL_TABLET | Freq: Once | ORAL | Status: AC
  Administered 2014-10-30: 500 mg via ORAL
  Filled 2014-10-30: qty 1

## 2014-10-30 MED ORDER — VITAMIN B-12 1000 MCG PO TABS
1000.0000 ug | ORAL_TABLET | Freq: Every day | ORAL | Status: DC
  Administered 2014-10-30 – 2014-11-01 (×2): 1000 ug via ORAL
  Filled 2014-10-30 (×2): qty 1

## 2014-10-30 NOTE — Care Management Note (Addendum)
Case Management Note  Patient Details  Name: Tyler Hale MRN: [id number] Date of Birth: Aug 08, 1926  Subjective/Objective:                   Met with patient who son Tyler Hale states is baseline dementia but worse since anesthesia yesterday for TURP. Patient went to [name] place about two years ago; MEDICARE OBSERVATION explained to Tyler Hale- no benefit for SNF this visit. Patient has had home health in the past but does not recall name of agency. Patient is a [name] but son Tyler Hale wants him to stay here at [name]. Action/Plan:  Forms faxed to [name] of patient (son) request to stay at [name]; message left for Tyler Hale with [facility] of patient's admission. List of home health agencies provided to son Tyler Hale. RNCM will continue to follow.   Expected Discharge Date:                  Expected Discharge Plan:     In-House Referral:     Discharge planning Services  CM Consult  Post Acute Care Choice:  Home Health Choice offered to:  Patient, Adult Children Tyler Hale)  DME Arranged:    DME Agency:     HH Arranged:    HH Agency:     Status of Service:  In process, will continue to follow  Medicare Important Message Given:    Date Medicare IM Given:    Medicare IM give by:    Date Additional Medicare IM Given:    Additional Medicare Important Message give by:     If discussed at [place] of Stay Meetings, dates discussed:    Additional Comments:  Tyler Garfinkel, RN 10/30/2014, 12:04 PM

## 2014-10-30 NOTE — Care Management (Signed)
Met with patient but he could not tell me where he was. He said he "was eating breakfast". I asked where and he started giving me his home address. There is no family present. I attempted to contact his wife which he states "just left to go home", so he knows he's not at home, but there was no answer. Medicare OBSERVATION Letter pending notification to family. RNCM will continue to follow.

## 2014-10-30 NOTE — H&P (Signed)
Tyler Hale is an 81 y.o. male.   Chief Complaint: Altered mental status HPI: The patient presents to the emergency department following an episode of seizure-like activity. The patient collapsed and convulsive for approximately 2-3 minutes. The patient was discharged from the hospital today following a prostate ablation and complained of nausea prior to vomiting. Both these symptoms preceded his seizure-like activity, after which he was very confused and less responsive.  His wife states that she was unable to observe any blood or bile. The patient tolerated his surgery without complication. Laboratory in the emergency apartment revealed acute on chronic kidney injury fall to the emergency department to call for admission.  Past Medical History  Diagnosis Date  . GERD (gastroesophageal reflux disease)   . 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 Cancer 2004    bladder  . Cancer     prostate  . Arthritis     Past Surgical History  Procedure Laterality Date  . Leg surgery Bilateral 2014  . Joint replacement Left     Total knee Replacement  . Hip pinning Left 1984    hit by tractor  . Fracture surgery Left     hip, jaw    Family History  Problem Relation Age of Onset  . Prostate cancer Brother   . Cancer - Other Brother     Liver   Social History:  reports that he has never smoked. He has never used smokeless tobacco. He reports that he does not drink alcohol or use illicit drugs.  Allergies:  Allergies  Allergen Reactions  . Prednisone Other (See Comments)    "blisters"  . Vioxx [Rofecoxib] Other (See Comments)    "bleeding of stomach"    Medications Prior to Admission  Medication Sig Dispense Refill  . omeprazole (PRILOSEC) 20 MG capsule Take 20 mg by mouth daily.    . vitamin B-12 (CYANOCOBALAMIN) 1000 MCG tablet Take 1,000 mcg by mouth daily.    Marland Kitchen acetaminophen-codeine (TYLENOL #3) 300-30 MG per tablet Take 1-2 tablets by mouth every 4 (four) hours as needed for moderate pain. 30  tablet 2  . docusate sodium (COLACE) 100 MG capsule Take 2 capsules (200 mg total) by mouth 2 (two) times daily. 120 capsule 3  . levofloxacin (LEVAQUIN) 500 MG tablet Take 1 tablet (500 mg total) by mouth daily. 7 tablet 0  . Meth-Hyo-M Bl-Na Phos-Ph Sal (URIBEL) 118 MG CAPS Take 1 capsule (118 mg total) by mouth every 6 (six) hours as needed (dysuria). 40 capsule 3  . ondansetron (ZOFRAN ODT) 8 MG disintegrating tablet Take 1 tablet (8 mg total) by mouth every 6 (six) hours as needed for nausea or vomiting. 10 tablet 3    Results for orders placed or performed during the hospital encounter of 10/29/14 (from the past 48 hour(s))  Glucose, capillary     Status: Abnormal   Collection Time: 10/29/14  9:06 PM  Result Value Ref Range   Glucose-Capillary 113 (H) 65 - 99 mg/dL  Comprehensive metabolic panel     Status: Abnormal   Collection Time: 10/29/14  9:14 PM  Result Value Ref Range   Sodium 140 135 - 145 mmol/L   Potassium 4.5 3.5 - 5.1 mmol/L   Chloride 116 (H) 101 - 111 mmol/L   CO2 18 (L) 22 - 32 mmol/L   Glucose, Bld 126 (H) 65 - 99 mg/dL   BUN 35 (H) 6 - 20 mg/dL   Creatinine, Ser 2.06 (H) 0.61 - 1.24 mg/dL  Calcium 8.4 (L) 8.9 - 10.3 mg/dL   Total Protein 6.5 6.5 - 8.1 g/dL   Albumin 3.4 (L) 3.5 - 5.0 g/dL   AST 19 15 - 41 U/L   ALT 9 (L) 17 - 63 U/L   Alkaline Phosphatase 80 38 - 126 U/L   Total Bilirubin 0.6 0.3 - 1.2 mg/dL   GFR calc non Af Amer 28 (L) >60 mL/min   GFR calc Af Amer 32 (L) >60 mL/min    Comment: (NOTE) The eGFR has been calculated using the CKD EPI equation. This calculation has not been validated in all clinical situations. eGFR's persistently <60 mL/min signify possible Chronic Kidney Disease.    Anion gap 6 5 - 15  CBC     Status: Abnormal   Collection Time: 10/29/14  9:14 PM  Result Value Ref Range   WBC 12.2 (H) 3.8 - 10.6 K/uL   RBC 4.51 4.40 - 5.90 MIL/uL   Hemoglobin 12.6 (L) 13.0 - 18.0 g/dL   HCT 38.4 (L) 40.0 - 52.0 %   MCV 85.2 80.0 -  100.0 fL   MCH 27.9 26.0 - 34.0 pg   MCHC 32.7 32.0 - 36.0 g/dL   RDW 14.4 11.5 - 14.5 %   Platelets 239 150 - 440 K/uL  Troponin I     Status: None   Collection Time: 10/29/14  9:14 PM  Result Value Ref Range   Troponin I <0.03 <0.031 ng/mL    Comment:        NO INDICATION OF MYOCARDIAL INJURY.   Urinalysis complete, with microscopic ([facility] only)     Status: Abnormal   Collection Time: 10/29/14 10:56 PM  Result Value Ref Range   Color, Urine RED (A) YELLOW   APPearance CLOUDY (A) CLEAR   Glucose, UA NEGATIVE NEGATIVE mg/dL   Bilirubin Urine NEGATIVE NEGATIVE   Ketones, ur NEGATIVE NEGATIVE mg/dL   Specific Gravity, Urine 1.011 1.005 - 1.030   Hgb urine dipstick 3+ (A) NEGATIVE   pH 6.0 5.0 - 8.0   Protein, ur 100 (A) NEGATIVE mg/dL   Nitrite NEGATIVE NEGATIVE   Leukocytes, UA NEGATIVE NEGATIVE   RBC / HPF TOO NUMEROUS TO COUNT 0 - 5 RBC/hpf   WBC, UA 6-30 0 - 5 WBC/hpf   Bacteria, UA RARE (A) NONE SEEN   Squamous Epithelial / LPF NONE SEEN NONE SEEN  Troponin I     Status: None   Collection Time: 10/30/14  4:09 AM  Result Value Ref Range   Troponin I 0.03 <0.031 ng/mL    Comment:        NO INDICATION OF MYOCARDIAL INJURY.    Dg Chest 1 View  10/29/2014   CLINICAL DATA:  Unresponsive. Status post prostatic surgery earlier today  EXAM: CHEST  1 VIEW  COMPARISON:  March 10, 2011  FINDINGS: Lungs are clear. Heart size and pulmonary vascularity normal. No adenopathy. No bone lesions.  IMPRESSION: No edema or consolidation.   Electronically Signed   By: Lowella Grip III M.D.   On: 10/29/2014 21:51    Review of Systems  Constitutional: Negative for fever and chills.  HENT: Negative for sore throat and tinnitus.   Eyes: Negative for blurred vision and redness.  Respiratory: Negative for cough and shortness of breath.   Cardiovascular: Negative for chest pain, palpitations, orthopnea and PND.  Gastrointestinal: Positive for nausea, vomiting and abdominal pain (Mild).  Negative for diarrhea.  Genitourinary: Negative for dysuria, urgency and frequency.  Musculoskeletal: Negative  for myalgias and joint pain.  Skin: Negative for rash.       No lesions  Neurological: Positive for seizures (??). Negative for speech change, focal weakness and weakness.  Endo/Heme/Allergies: Does not bruise/bleed easily.       No temperature intolerance  Psychiatric/Behavioral: Negative for depression and suicidal ideas.    Blood pressure 157/72, pulse 70, temperature 97.8 F (36.6 C), temperature source Oral, resp. rate 18, height 5' 10"  (1.778 m), weight 79.243 kg (174 lb 11.2 oz), SpO2 99 %. Physical Exam  Vitals reviewed. Constitutional: He is oriented to person, place, and time. He appears well-developed and well-nourished. No distress.  HENT:  Head: Normocephalic and atraumatic.  Mouth/Throat: Oropharynx is clear and moist.  Eyes: Conjunctivae and EOM are normal. Pupils are equal, round, and reactive to light. No scleral icterus.  Neck: Normal range of motion. Neck supple. No JVD present. No tracheal deviation present. No thyromegaly present.  Cardiovascular: Normal rate, regular rhythm, normal heart sounds and intact distal pulses.  Exam reveals no gallop and no friction rub.   No murmur heard. Respiratory: Effort normal and breath sounds normal.  GI: Soft. Bowel sounds are normal. He exhibits no distension and no mass. There is tenderness. There is no rebound and no guarding.  Genitourinary:  Foley in place  Musculoskeletal: Normal range of motion. He exhibits no edema.  Lymphadenopathy:    He has no cervical adenopathy.  Neurological: He is alert and oriented to person, place, and time. No cranial nerve deficit.  Skin: Skin is warm and dry. No rash noted. No erythema.  Psychiatric: He has a normal mood and affect. His speech is normal and behavior is normal. Judgment and thought content normal. He exhibits abnormal recent memory.     Assessment/Plan This is an  81 year old Caucasian male admitted for seizure activity and acute on chronic kidney disease. 1. Seizure activity: Motion of the event is somewhat vague as provided by his wife. Perhaps his altered mental status following set activity was a postictal phase. The patient has past medical history significant for traumatic brain injury in which case observed visual behavior may be a sequelae of brain damage. I have not loaded the patient with any antiepileptics at this time but if we observe more seizure activity we will obtain neurology consultation and likely start preventative antibiotic medication. 2. Confusion: The patient has no short-term memory loss following history married brain injury. He may be confused at this time due to being postictal or he may have a component of dementia as well. The patient had prostate cancer that spread to his bladder. 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 Continue to reorient and observe. 3. Acute on chronic kidney disease: Aggressively hydrate patient. Avoid nephrotoxic agents. Likely postrenal obstruction secondary to clotting within the urethra as the patient has grossly bloody urine in his Foley bag. This is expected following prostate ablation. 4. Prostate cancer: Status post ablation. Continue Levaquin 5. DVT prophylaxis: Subcutaneous heparin 6. GI prophylaxis: None  The patient is a full code. Time spent on admission orders and patient care approximately 35 minutes  Harrie Foreman 10/30/2014, 7:31 AM

## 2014-10-30 NOTE — Progress Notes (Signed)
ANTIBIOTIC CONSULT NOTE - INITIAL  Pharmacy Consult for Levaquin Indication: Unkown  Allergies  Allergen Reactions  . Prednisone Other (See Comments)    "blisters"  . Vioxx [Rofecoxib] Other (See Comments)    "bleeding of stomach"    Patient Measurements: Height: 5\' 10"  (177.8 cm) Weight: 188 lb (85.276 kg) IBW/kg (Calculated) : 73  Vital Signs: Temp: 97.8 F (36.6 C) (08/03 0146) Temp Source: Oral (08/03 0146) BP: 157/72 mmHg (08/03 0146) Pulse Rate: 70 (08/03 0146) Intake/Output from previous day:   Intake/Output from this shift:    Labs:  Recent Labs  10/29/14 2114  WBC 12.2*  HGB 12.6*  PLT 239  CREATININE 2.06*   Estimated Creatinine Clearance: 27.1 mL/min (by C-G formula based on Cr of 2.06). No results for input(s): VANCOTROUGH, VANCOPEAK, VANCORANDOM, GENTTROUGH, GENTPEAK, GENTRANDOM, TOBRATROUGH, TOBRAPEAK, TOBRARND, AMIKACINPEAK, AMIKACINTROU, AMIKACIN in the last 72 hours.   Microbiology: No results found for this or any previous visit (from the past 720 hour(s)).  Medical History: Past Medical History  Diagnosis Date  . GERD (gastroesophageal reflux disease)   . 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 Cancer 2004    bladder  . Cancer     prostate  . Arthritis     Medications:  Scheduled:  . docusate sodium  200 mg Oral BID  . heparin  5,000 Units Subcutaneous 3 times per day  . levofloxacin  250 mg Oral q1800  . levofloxacin  500 mg Oral Once  . sodium chloride  1,000 mL Intravenous Once  . vitamin B-12  1,000 mcg Oral Daily   Infusions:  . sodium chloride 100 mL/hr at 10/30/14 0156   PRN: morphine injection, ondansetron, URIBEL  Assessment: 81 y/o M admitted with confusion. No MD note at present.   Plan:  MD ordered Levaquin 500 mg daily. Will change to 500 mg once then 250 mg daily adjusted for renal function.   Ulice Dash D 10/30/2014,2:02 AM

## 2014-10-30 NOTE — Progress Notes (Signed)
Took over pt care at 1315, pt alert, hard of hearing, family at bedside, foley in place and draining red urine, no complaints of pain, no distress or discomfort noted, pt voices any needs

## 2014-10-30 NOTE — Progress Notes (Signed)
ANTIBIOTIC CONSULT NOTE - INITIAL  Pharmacy Consult for Levaquin Indication: urological procedure  Allergies  Allergen Reactions  . Prednisone Other (See Comments)    "blisters"  . Vioxx [Rofecoxib] Other (See Comments)    "bleeding of stomach"    Patient Measurements: Height: 5\' 10"  (177.8 cm) Weight: 174 lb 11.2 oz (79.243 kg) IBW/kg (Calculated) : 73  Vital Signs: Temp: 98.2 F (36.8 C) (08/03 1109) Temp Source: Oral (08/03 1109) BP: 133/54 mmHg (08/03 1109) Pulse Rate: 77 (08/03 1109) Intake/Output from previous day: 08/02 0701 - 08/03 0700 In: -  Out: 400 [Urine:400] Intake/Output from this shift: Total I/O In: 240 [P.O.:240] Out: 500 [Urine:500]  Labs:  Recent Labs  10/29/14 2114  WBC 12.2*  HGB 12.6*  PLT 239  CREATININE 2.06*   Estimated Creatinine Clearance: 27.1 mL/min (by C-G formula based on Cr of 2.06). No results for input(s): VANCOTROUGH, VANCOPEAK, VANCORANDOM, GENTTROUGH, GENTPEAK, GENTRANDOM, TOBRATROUGH, TOBRAPEAK, TOBRARND, AMIKACINPEAK, AMIKACINTROU, AMIKACIN in the last 72 hours.   Microbiology: Recent Results (from the past 720 hour(s))  Blood culture (routine x 2)     Status: None (Preliminary result)   Collection Time: 10/29/14 10:56 PM  Result Value Ref Range Status   Specimen Description BLOOD LEFT WRIST  Final   Special Requests   Final    BOTTLES DRAWN AEROBIC AND ANAEROBIC 1CCANAEROBIC, 2CCAEROBIC   Culture NO GROWTH < 24 HOURS  Final   Report Status PENDING  Incomplete  Blood culture (routine x 2)     Status: None (Preliminary result)   Collection Time: 10/29/14 10:56 PM  Result Value Ref Range Status   Specimen Description BLOOD RIGHT ASSIST CONTROL  Final   Special Requests BOTTLES DRAWN AEROBIC AND ANAEROBIC [place]  Final   Culture NO GROWTH < 24 HOURS  Final   Report Status PENDING  Incomplete  Urine culture     Status: None (Preliminary result)   Collection Time: 10/29/14 10:56 PM  Result Value Ref Range Status   Specimen Description URINE, RANDOM  Final   Special Requests NONE  Final   Culture NO GROWTH < 12 HOURS  Final   Report Status PENDING  Incomplete    Medical History: Past Medical History  Diagnosis Date  . GERD (gastroesophageal reflux disease)   . 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 Cancer 2004    bladder  . Cancer     prostate  . Arthritis     Medications:  Scheduled:  . docusate sodium  200 mg Oral BID  . levofloxacin  250 mg Oral q1800  . vitamin B-12  1,000 mcg Oral Daily   Infusions:  . sodium chloride 75 mL/hr at 10/30/14 1354   PRN: morphine injection, ondansetron, URELLE  Assessment: Pharmacy consulted to renal dose adjust levaquin in this 81 year old male admitted for AMS. Patient had urological procedure (prostate ablation) on 8/2, and was prescribed levaquin 500mg  PO daily following procedure.   Plan:  MD ordered Levaquin 500 mg daily, changed to 500 mg once then 250 mg daily adjusted for renal function.   Rexene Edison, PharmD Clinical Pharmacist  10/30/2014,2:54 PM

## 2014-10-30 NOTE — Progress Notes (Signed)
OBS letter sent to CM on 1A

## 2014-10-30 NOTE — Plan of Care (Signed)
Problem: Discharge Progression Outcomes Goal: Other Discharge Outcomes/Goals Outcome: Progressing Pt readmitted for Snycopal episode after a outpatient TURP VSS and tele shows a HR of 77 with a first degree heartblock, reported to [facility]. Pt consult ordered for, pt may need SNF placement Foley  intact and urine is a lighter red color, no clots present Encouraged PO intake.

## 2014-10-30 NOTE — ED Notes (Signed)
Admitting MD at bedside, completing admitting evaluation.

## 2014-10-30 NOTE — Progress Notes (Signed)
[facility] [place] NAME: Tyler Hale    MR#:  [id number]  DATE OF BIRTH:  [birth year]  SUBJECTIVE:  CHIEF COMPLAINT:   Chief Complaint  Patient presents with  . Altered Mental Status  . Weakness  . Post-op Problem   weakness but no seizure activity since admission.  REVIEW OF SYSTEMS:  CONSTITUTIONAL: No fever, generalized weakness.  EYES: No blurred or double vision.  EARS, NOSE, AND THROAT: No tinnitus or ear pain.  RESPIRATORY: No cough, shortness of breath, wheezing or hemoptysis.  CARDIOVASCULAR: No chest pain, orthopnea, edema.  GASTROINTESTINAL: No nausea, vomiting, diarrhea or abdominal pain.  GENITOURINARY: No dysuria, has hematuria.  ENDOCRINE: No polyuria, nocturia,  HEMATOLOGY: No anemia, easy bruising or bleeding SKIN: No rash or lesion. MUSCULOSKELETAL: No joint pain or arthritis.   NEUROLOGIC: No tingling, numbness, weakness.  PSYCHIATRY: No anxiety or depression.   DRUG ALLERGIES:   Allergies  Allergen Reactions  . Prednisone Other (See Comments)    "blisters"  . Vioxx [Rofecoxib] Other (See Comments)    "bleeding of stomach"    VITALS:  Blood pressure 158/84, pulse 74, temperature 97.8 F (36.6 C), temperature source Oral, resp. rate 16, height 5\' 10"  (1.778 m), weight 79.243 kg (174 lb 11.2 oz), SpO2 98 %.  PHYSICAL EXAMINATION:  GENERAL:  81 y.o.-year-old patient lying in the bed with no acute distress.  EYES: Pupils equal, round, reactive to light and accommodation. No scleral icterus. Extraocular muscles intact.  HEENT: Head atraumatic, normocephalic. Oropharynx and nasopharynx clear.  NECK:  Supple, no jugular venous distention. No thyroid enlargement, no tenderness.  LUNGS: Normal breath sounds bilaterally, no wheezing, rales,rhonchi or crepitation. No use of accessory muscles of respiration.  CARDIOVASCULAR: S1, S2 normal. No murmurs, rubs, or gallops.  ABDOMEN: Soft, nontender,  nondistended. Bowel sounds present. No organomegaly or mass.  EXTREMITIES: No pedal edema, cyanosis, or clubbing.  NEUROLOGIC: Cranial nerves II through XII are intact. Muscle strength 3/5 in all extremities. Sensation intact. Gait not checked.  PSYCHIATRIC: The patient is confused. SKIN: No obvious rash, lesion, or ulcer.    LABORATORY PANEL:   CBC  Recent Labs Lab 10/29/14 2114  WBC 12.2*  HGB 12.6*  HCT 38.4*  PLT 239   ------------------------------------------------------------------------------------------------------------------  Chemistries   Recent Labs Lab 10/29/14 2114  NA 140  K 4.5  CL 116*  CO2 18*  GLUCOSE 126*  BUN 35*  CREATININE 2.06*  CALCIUM 8.4*  AST 19  ALT 9*  ALKPHOS 80  BILITOT 0.6   ------------------------------------------------------------------------------------------------------------------  Cardiac Enzymes  Recent Labs Lab 10/30/14 0409  TROPONINI 0.03   ------------------------------------------------------------------------------------------------------------------  RADIOLOGY:  Dg Chest 1 View  10/29/2014   CLINICAL DATA:  Unresponsive. Status post prostatic surgery earlier today  EXAM: CHEST  1 VIEW  COMPARISON:  March 10, 2011  FINDINGS: Lungs are clear. Heart size and pulmonary vascularity normal. No adenopathy. No bone lesions.  IMPRESSION: No edema or consolidation.   Electronically Signed   By: Lowella Grip III M.D.   On: 10/29/2014 21:51    EKG:   Orders placed or performed during the hospital encounter of 10/29/14  . EKG 12-Lead  . EKG 12-Lead    ASSESSMENT AND PLAN:   1. Seizure activity:  No more seizure activity. No antiepileptics at this time.  2. Confusion. Unclear etiology. Aspiration and fall precaution.  3. Acute on chronic kidney disease: Continue IV fluids to support and follow-up BMP. Avoid nephrotoxic agents.  4. Prostate cancer: Status post ablation. Still hematuria in Foley bag and  leukocytosis. Continue Levaquin, follow-up CBC.   All the records are reviewed and case discussed with Care Management/Social Workerr. Management plans discussed with the patient, family and they are in agreement.  CODE STATUS: Full code  TOTAL TIME TAKING CARE OF THIS PATIENT: 36 minutes.   POSSIBLE D/C IN 1-2 DAYS, DEPENDING ON CLINICAL CONDITION.   Demetrios Loll M.D on 10/30/2014 at 4:26 PM  Between 7am to 6pm - Pager - [phone]  After 6pm go to [email or web address] - password [email or web address] [facility]  Office  [phone]  CC: Primary care physician; Albina Billet, MD

## 2014-10-30 NOTE — Progress Notes (Signed)
Patient arrived to unit from ED. Patient is alert to self and place. Skin CDI, IV fluids infusing. Patient denies pain. Family at bedside. Call bell within reach.

## 2014-10-31 DIAGNOSIS — N138 Other obstructive and reflux uropathy: Secondary | ICD-10-CM | POA: Diagnosis present

## 2014-10-31 DIAGNOSIS — F0280 Dementia in other diseases classified elsewhere without behavioral disturbance: Secondary | ICD-10-CM | POA: Diagnosis present

## 2014-10-31 DIAGNOSIS — Z8551 Personal history of malignant neoplasm of bladder: Secondary | ICD-10-CM | POA: Diagnosis not present

## 2014-10-31 DIAGNOSIS — Z888 Allergy status to other drugs, medicaments and biological substances status: Secondary | ICD-10-CM | POA: Diagnosis not present

## 2014-10-31 DIAGNOSIS — R310 Gross hematuria: Secondary | ICD-10-CM | POA: Diagnosis present

## 2014-10-31 DIAGNOSIS — Z800 Family history of malignant neoplasm of digestive organs: Secondary | ICD-10-CM | POA: Diagnosis not present

## 2014-10-31 DIAGNOSIS — N401 Enlarged prostate with lower urinary tract symptoms: Secondary | ICD-10-CM | POA: Diagnosis present

## 2014-10-31 DIAGNOSIS — Z96659 Presence of unspecified artificial knee joint: Secondary | ICD-10-CM | POA: Diagnosis present

## 2014-10-31 DIAGNOSIS — Z87820 Personal history of traumatic brain injury: Secondary | ICD-10-CM | POA: Diagnosis not present

## 2014-10-31 DIAGNOSIS — E875 Hyperkalemia: Secondary | ICD-10-CM | POA: Diagnosis present

## 2014-10-31 DIAGNOSIS — Z8042 Family history of malignant neoplasm of prostate: Secondary | ICD-10-CM | POA: Diagnosis not present

## 2014-10-31 DIAGNOSIS — R55 Syncope and collapse: Secondary | ICD-10-CM | POA: Diagnosis present

## 2014-10-31 DIAGNOSIS — R4182 Altered mental status, unspecified: Secondary | ICD-10-CM | POA: Diagnosis present

## 2014-10-31 DIAGNOSIS — K219 Gastro-esophageal reflux disease without esophagitis: Secondary | ICD-10-CM | POA: Diagnosis present

## 2014-10-31 DIAGNOSIS — N189 Chronic kidney disease, unspecified: Secondary | ICD-10-CM | POA: Diagnosis present

## 2014-10-31 DIAGNOSIS — R410 Disorientation, unspecified: Secondary | ICD-10-CM | POA: Diagnosis present

## 2014-10-31 DIAGNOSIS — C61 Malignant neoplasm of prostate: Secondary | ICD-10-CM | POA: Diagnosis present

## 2014-10-31 DIAGNOSIS — R569 Unspecified convulsions: Secondary | ICD-10-CM | POA: Diagnosis present

## 2014-10-31 DIAGNOSIS — N179 Acute kidney failure, unspecified: Secondary | ICD-10-CM | POA: Diagnosis present

## 2014-10-31 LAB — BASIC METABOLIC PANEL
ANION GAP: 4 — AB (ref 5–15)
ANION GAP: 7 (ref 5–15)
Anion gap: 5 (ref 5–15)
BUN: 29 mg/dL — AB (ref 6–20)
BUN: 30 mg/dL — AB (ref 6–20)
BUN: 29 mg/dL — AB (ref 6–20)
CALCIUM: 8.8 mg/dL — AB (ref 8.9–10.3)
CALCIUM: 8.5 mg/dL — AB (ref 8.9–10.3)
CHLORIDE: 113 mmol/L — AB (ref 101–111)
CHLORIDE: 113 mmol/L — AB (ref 101–111)
CO2: 17 mmol/L — ABNORMAL LOW (ref 22–32)
CO2: 21 mmol/L — ABNORMAL LOW (ref 22–32)
CO2: 20 mmol/L — ABNORMAL LOW (ref 22–32)
Calcium: 8.1 mg/dL — ABNORMAL LOW (ref 8.9–10.3)
Chloride: 118 mmol/L — ABNORMAL HIGH (ref 101–111)
Creatinine, Ser: 1.91 mg/dL — ABNORMAL HIGH (ref 0.61–1.24)
Creatinine, Ser: 1.93 mg/dL — ABNORMAL HIGH (ref 0.61–1.24)
Creatinine, Ser: 2.04 mg/dL — ABNORMAL HIGH (ref 0.61–1.24)
GFR calc Af Amer: 35 mL/min — ABNORMAL LOW (ref >60)
GFR calc Af Amer: 35 mL/min — ABNORMAL LOW (ref >60)
GFR calc non Af Amer: 30 mL/min — ABNORMAL LOW (ref >60)
GFR calc non Af Amer: 30 mL/min — ABNORMAL LOW (ref >60)
GFR, EST AFRICAN AMERICAN: 33 mL/min — AB (ref >60)
GFR, EST NON AFRICAN AMERICAN: 28 mL/min — AB (ref >60)
GLUCOSE: 93 mg/dL (ref 65–99)
Glucose, Bld: 103 mg/dL — ABNORMAL HIGH (ref 65–99)
Glucose, Bld: 100 mg/dL — ABNORMAL HIGH (ref 65–99)
POTASSIUM: 6.1 mmol/L — AB (ref 3.5–5.1)
Potassium: 5.3 mmol/L — ABNORMAL HIGH (ref 3.5–5.1)
Potassium: 5.4 mmol/L — ABNORMAL HIGH (ref 3.5–5.1)
SODIUM: 140 mmol/L (ref 135–145)
Sodium: 139 mmol/L (ref 135–145)
Sodium: 139 mmol/L (ref 135–145)

## 2014-10-31 LAB — URINE CULTURE: CULTURE: NO GROWTH

## 2014-10-31 LAB — CBC
HCT: 36.4 % — ABNORMAL LOW (ref 40.0–52.0)
Hemoglobin: 11.8 g/dL — ABNORMAL LOW (ref 13.0–18.0)
MCH: 27.8 pg (ref 26.0–34.0)
MCHC: 32.3 g/dL (ref 32.0–36.0)
MCV: 86.1 fL (ref 80.0–100.0)
Platelets: 178 10*3/uL (ref 150–440)
RBC: 4.23 MIL/uL — ABNORMAL LOW (ref 4.40–5.90)
RDW: 14.3 % (ref 11.5–14.5)
WBC: 8.4 10*3/uL (ref 3.8–10.6)

## 2014-10-31 MED ORDER — SODIUM POLYSTYRENE SULFONATE 15 GM/60ML PO SUSP
30.0000 g | ORAL | Status: AC
  Administered 2014-10-31: 30 g via ORAL
  Filled 2014-10-31: qty 120

## 2014-10-31 MED ORDER — LORAZEPAM 2 MG/ML IJ SOLN
0.5000 mg | Freq: Once | INTRAMUSCULAR | Status: AC
  Administered 2014-10-31: 0.5 mg via INTRAVENOUS
  Filled 2014-10-31: qty 1

## 2014-10-31 MED ORDER — HALOPERIDOL LACTATE 5 MG/ML IJ SOLN
1.0000 mg | Freq: Four times a day (QID) | INTRAMUSCULAR | Status: DC | PRN
  Administered 2014-10-31: 1 mg via INTRAVENOUS
  Filled 2014-10-31 (×2): qty 1

## 2014-10-31 NOTE — Discharge Instructions (Signed)
Regular diet. °Activity as tolerated. °

## 2014-10-31 NOTE — Progress Notes (Signed)
Patient agitated and confused alert to self. Patient physically combative and states" Im going home Im leaving".Patient attempting to pull out foley and IV. Nursing in room patient becoming  more agressive and combative. Dr. Reece Levy notified new orders giving. Nursing will continue to monitor.

## 2014-10-31 NOTE — Care Management (Signed)
RN trying to assist patient and his wife to discharge and patient would not "sit down". He is very hard of hearing but also agitated. He is uncomfortable- received lactulose to hyperkalemia and Foley was removed today. He is now back in bed. RN states "wife cannot handle him at home like this". I met with Tyler Hale and she stated "he may be better at home but if I can't handle him I will have to bring him back". Patient admitted to hospital for hyperkalemia monitoring which altered mental status is not helping patient status either.
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
## 2014-10-31 NOTE — Progress Notes (Signed)
[facility] [place] NAME: Tyler Hale    MR#:  [id number]  DATE OF BIRTH:  [birth year]  SUBJECTIVE:  CHIEF COMPLAINT:   Chief Complaint  Patient presents with  . Altered Mental Status  . Weakness  . Post-op Problem   no complaint.  REVIEW OF SYSTEMS:  CONSTITUTIONAL: No fever, generalized weakness.  EYES: No blurred or double vision.  EARS, NOSE, AND THROAT: No tinnitus or ear pain.  RESPIRATORY: No cough, shortness of breath, wheezing or hemoptysis.  CARDIOVASCULAR: No chest pain, orthopnea, edema.  GASTROINTESTINAL: No nausea, vomiting, diarrhea or abdominal pain.  GENITOURINARY: No dysuria, has hematuria.  ENDOCRINE: No polyuria, nocturia,  HEMATOLOGY: No anemia, easy bruising or bleeding SKIN: No rash or lesion. MUSCULOSKELETAL: No joint pain or arthritis.   NEUROLOGIC: No tingling, numbness, weakness.  PSYCHIATRY: No anxiety or depression.   DRUG ALLERGIES:   Allergies  Allergen Reactions  . Prednisone Other (See Comments)    "blisters"  . Vioxx [Rofecoxib] Other (See Comments)    "bleeding of stomach"    VITALS:  Blood pressure 161/88, pulse 113, temperature 98.6 F (37 C), temperature source Oral, resp. rate 16, height 5\' 10"  (1.778 m), weight 79.8 kg (175 lb 14.8 oz), SpO2 98 %.  PHYSICAL EXAMINATION:  GENERAL:  81 y.o.-year-old patient lying in the bed with no acute distress.  EYES: Pupils equal, round, reactive to light and accommodation. No scleral icterus. Extraocular muscles intact.  HEENT: Head atraumatic, normocephalic. Oropharynx and nasopharynx clear.  NECK:  Supple, no jugular venous distention. No thyroid enlargement, no tenderness.  LUNGS: Normal breath sounds bilaterally, no wheezing, rales,rhonchi or crepitation. No use of accessory muscles of respiration.  CARDIOVASCULAR: S1, S2 normal. No murmurs, rubs, or gallops.  ABDOMEN: Soft, nontender, nondistended. Bowel sounds present. No  organomegaly or mass.  EXTREMITIES: No pedal edema, cyanosis, or clubbing.  NEUROLOGIC: Cranial nerves II through XII are intact. Muscle strength 3/5 in all extremities. Sensation intact. Gait not checked.  PSYCHIATRIC: The patient is confused. SKIN: No obvious rash, lesion, or ulcer.    LABORATORY PANEL:   CBC  Recent Labs Lab 10/31/14 0521  WBC 8.4  HGB 11.8*  HCT 36.4*  PLT 178   ------------------------------------------------------------------------------------------------------------------  Chemistries   Recent Labs Lab 10/29/14 2114  10/31/14 1138  NA 140  < > 139  K 4.5  < > 6.1*  CL 116*  < > 113*  CO2 18*  < > 21*  GLUCOSE 126*  < > 103*  BUN 35*  < > 30*  CREATININE 2.06*  < > 1.93*  CALCIUM 8.4*  < > 8.8*  AST 19  --   --   ALT 9*  --   --   ALKPHOS 80  --   --   BILITOT 0.6  --   --   < > = values in this interval not displayed. ------------------------------------------------------------------------------------------------------------------  Cardiac Enzymes  Recent Labs Lab 10/30/14 0409  TROPONINI 0.03   ------------------------------------------------------------------------------------------------------------------  RADIOLOGY:  Dg Chest 1 View  10/29/2014   CLINICAL DATA:  Unresponsive. Status post prostatic surgery earlier today  EXAM: CHEST  1 VIEW  COMPARISON:  March 10, 2011  FINDINGS: Lungs are clear. Heart size and pulmonary vascularity normal. No adenopathy. No bone lesions.  IMPRESSION: No edema or consolidation.   Electronically Signed   By: Lowella Grip III M.D.   On: 10/29/2014 21:51    EKG:   Orders placed or performed during  the hospital encounter of 10/29/14  . EKG 12-Lead  . EKG 12-Lead    ASSESSMENT AND PLAN:   1. Seizure activity:  No more seizure activity. No antiepileptics at this time.  2. Confusion. The patient is demented and agitated. Aspiration and fall precaution. Haldol when necessary.  3. Acute on  chronic kidney disease: Improving. Continue IV fluids to support and follow-up BMP. Avoid nephrotoxic agents.  * Hyperkalemia. The patient was treated with Kayexalate this morning, but the potassium level is still high at 6.1. I will give another Kayexalate and follow-up potassium level.  4. Prostate cancer: Status post ablation.  Better hematuria in Foley bag and leukocytosis improved . Continue Levaquin,  discontinue Foley catheter per Dr. Yves Dill.   All the records are reviewed and case discussed with Care Management/Social Workerr. Management plans discussed with the patient, family and they are in agreement. >50% time spent on counselling and coordination of care. CODE STATUS: Full code  TOTAL TIME TAKING CARE OF THIS PATIENT: 42  minutes.   POSSIBLE D/C IN 1-2 DAYS, DEPENDING ON CLINICAL CONDITION.   Demetrios Loll M.D on 10/31/2014 at 3:08 PM  Between 7am to 6pm - Pager - [phone]  After 6pm go to [email or web address] - password [email or web address] [facility]  Office  [phone]  CC: Primary care physician; Albina Billet, MD

## 2014-10-31 NOTE — Care Management (Signed)
Met with patient's wife. Plan for discharge to home today with home health. She would like to use [facility]. She states patient missed his appointment today with Dr. Eliberto Ivory urologist that placed Foley after TURP. Dr. Bridgett Larsson concerned with removing because it is assumed to have been surgically placed by urologist. I encouraged Dr Bridgett Larsson to speak with Dr. Eliberto Ivory as Dr. Eliberto Ivory did not have consult to see this patient and RN can not take orders from on [place]. Dr. Bridgett Larsson spoke with Dr. Eliberto Ivory- "RN can pull Foley". Foley pulled by RN pending void. Wife would like to use [facility] (RN, PT, SW). PT stating patient may need Assisted Living which [facility] can assist with that; wife wants to take him home. He has a rolling walker but needs close observation and prompts for use. He has 24/7 with him. HHRN will need to monitor post TURP for urinary complications. After void- patient will discharge home today. Corene Cornea with [facility] notified.

## 2014-10-31 NOTE — Progress Notes (Signed)
Dr. Rogers Blocker spoke to Dr. Bridgett Larsson on phone and said it was OK to discontinue foley prior to discharge.

## 2014-11-01 LAB — BASIC METABOLIC PANEL
Anion gap: 4 — ABNORMAL LOW (ref 5–15)
BUN: 33 mg/dL — AB (ref 6–20)
CHLORIDE: 117 mmol/L — AB (ref 101–111)
CO2: 17 mmol/L — ABNORMAL LOW (ref 22–32)
Calcium: 7.9 mg/dL — ABNORMAL LOW (ref 8.9–10.3)
Creatinine, Ser: 2.08 mg/dL — ABNORMAL HIGH (ref 0.61–1.24)
GFR calc Af Amer: 32 mL/min — ABNORMAL LOW (ref >60)
GFR, EST NON AFRICAN AMERICAN: 27 mL/min — AB (ref >60)
GLUCOSE: 103 mg/dL — AB (ref 65–99)
Potassium: 4.7 mmol/L (ref 3.5–5.1)
Sodium: 138 mmol/L (ref 135–145)

## 2014-11-01 NOTE — Progress Notes (Signed)
Prime doc paged for sitter order. Dr. Jannifer Franklin will place in [facility]. Sitter at pt bedside for safety.

## 2014-11-01 NOTE — Care Management (Signed)
Creatinine up 2.08, temp 99.8 F. K+ 4.7. No more diarrhea per RN. MD discharge to home today. Corene Cornea with [facility] notified of patient discharge.

## 2014-11-01 NOTE — Discharge Summary (Signed)
[facility] [place] NAME: Tyler Hale    MR#:  [id number]  DATE OF BIRTH:  12-Jul-1926  DATE OF ADMISSION:  10/29/2014 ADMITTING PHYSICIAN: Harrie Foreman, MD  DATE OF DISCHARGE: 11/01/2014 11:42 AM  PRIMARY CARE PHYSICIAN: Albina Billet, MD    ADMISSION DIAGNOSIS:  Syncope and collapse [R55]   DISCHARGE DIAGNOSIS:  Confusion  Acute renal failure on CKD Hyperkalemia  SECONDARY DIAGNOSIS:   Past Medical History  Diagnosis Date  . GERD (gastroesophageal reflux disease)   . 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 Cancer 2004    bladder  . Cancer     prostate  . Arthritis     HOSPITAL COURSE:   1. Seizure activity:  No more seizure activity. No antiepileptics at this time.  2. Confusion, unclear etiology, possible due to procedure and the environment. The patient was demented and agitated. Aspiration and fall precaution. Haldol when necessary. Patient mental status improved to his baseline.  3. Acute on chronic kidney disease: Improving after treatment of IV fluid. Avoid nephrotoxic agents.  * Hyperkalemia. The patient was treated with Kayexalate this morning, but the potassium level was still high at 6.1 yesterday. He was given another Kayexalate and potassium decreased to 4.7 today.  4. Prostate cancer: Status post ablation.  Continue Levaquin, removed Foley catheter per Dr. Yves Dill.  DISCHARGE CONDITIONS:   Stable, the patient was discharged to home with home health and PT.  CONSULTS OBTAINED:     DRUG ALLERGIES:   Allergies  Allergen Reactions  . Prednisone Other (See Comments)    "blisters"  . Vioxx [Rofecoxib] Other (See Comments)    "bleeding of stomach"    DISCHARGE MEDICATIONS:   Discharge Medication List as of 11/01/2014 10:58 AM    CONTINUE these medications which have NOT CHANGED   Details  omeprazole (PRILOSEC) 20 MG capsule Take 20 mg by mouth daily., Until Discontinued, Historical Med    vitamin  B-12 (CYANOCOBALAMIN) 1000 MCG tablet Take 1,000 mcg by mouth daily., Until Discontinued, Historical Med    acetaminophen-codeine (TYLENOL #3) 300-30 MG per tablet Take 1-2 tablets by mouth every 4 (four) hours as needed for moderate pain., Starting 10/29/2014, Until Discontinued, Print    docusate sodium (COLACE) 100 MG capsule Take 2 capsules (200 mg total) by mouth 2 (two) times daily., Starting 10/29/2014, Until Discontinued, Print    levofloxacin (LEVAQUIN) 500 MG tablet Take 1 tablet (500 mg total) by mouth daily., Starting 10/29/2014, Until Discontinued, Print    Meth-Hyo-M Bl-Na Phos-Ph Sal (URIBEL) 118 MG CAPS Take 1 capsule (118 mg total) by mouth every 6 (six) hours as needed (dysuria)., Starting 10/29/2014, Until Discontinued, Print    ondansetron (ZOFRAN ODT) 8 MG disintegrating tablet Take 1 tablet (8 mg total) by mouth every 6 (six) hours as needed for nausea or vomiting., Starting 10/29/2014, Until Discontinued, Print         DISCHARGE INSTRUCTIONS:    If you experience worsening of your admission symptoms, develop shortness of breath, life threatening emergency, suicidal or homicidal thoughts you must seek medical attention immediately by calling 911 or calling your MD immediately  if symptoms less severe.  You Must read complete instructions/literature along with all the possible adverse reactions/side effects for all the Medicines you take and that have been prescribed to you. Take any new Medicines after you have completely understood and accept all the possible adverse reactions/side effects.   Please note  You were cared for by  a hospitalist during your hospital stay. If you have any questions about your discharge medications or the care you received while you were in the hospital after you are discharged, you can call the unit and asked to speak with the hospitalist on call if the hospitalist that took care of you is not available. Once you are discharged, your primary care  physician will handle any further medical issues. Please note that NO REFILLS for any discharge medications will be authorized once you are discharged, as it is imperative that you return to your primary care physician (or establish a relationship with a primary care physician if you do not have one) for your aftercare needs so that they can reassess your need for medications and monitor your lab values.    Today   SUBJECTIVE      VITAL SIGNS:  Blood pressure 111/55, pulse 78, temperature 99.8 F (37.7 C), temperature source Axillary, resp. rate 18, height 5\' 10"  (1.778 m), weight 79.652 kg (175 lb 9.6 oz), SpO2 97 %.  I/O:   Intake/Output Summary (Last 24 hours) at 11/01/14 1532 Last data filed at 10/31/14 1712  Gross per 24 hour  Intake    240 ml  Output      0 ml  Net    240 ml    PHYSICAL EXAMINATION:  GENERAL:  81 y.o.-year-old patient lying in the bed with no acute distress.  EYES: Pupils equal, round, reactive to light and accommodation. No scleral icterus. Extraocular muscles intact.  HEENT: Head atraumatic, normocephalic. Oropharynx and nasopharynx clear.  NECK:  Supple, no jugular venous distention. No thyroid enlargement, no tenderness.  LUNGS: Normal breath sounds bilaterally, no wheezing, rales,rhonchi or crepitation. No use of accessory muscles of respiration.  CARDIOVASCULAR: S1, S2 normal. No murmurs, rubs, or gallops.  ABDOMEN: Soft, non-tender, non-distended. Bowel sounds present. No organomegaly or mass.  EXTREMITIES: No pedal edema, cyanosis, or clubbing.  NEUROLOGIC: Cranial nerves II through XII are intact. Muscle strength 4/5 in all extremities. Sensation intact. Gait not checked.   PSYCHIATRIC: The patient is awake but demented. SKIN: No obvious rash, lesion, or ulcer.   DATA REVIEW:   CBC  Recent Labs Lab 10/31/14 0521  WBC 8.4  HGB 11.8*  HCT 36.4*  PLT 178    Chemistries   Recent Labs Lab 10/29/14 2114  11/01/14 0548  NA 140  < > 138   K 4.5  < > 4.7  CL 116*  < > 117*  CO2 18*  < > 17*  GLUCOSE 126*  < > 103*  BUN 35*  < > 33*  CREATININE 2.06*  < > 2.08*  CALCIUM 8.4*  < > 7.9*  AST 19  --   --   ALT 9*  --   --   ALKPHOS 80  --   --   BILITOT 0.6  --   --   < > = values in this interval not displayed.  Cardiac Enzymes  Recent Labs Lab 10/30/14 0409  TROPONINI 0.03    Microbiology Results  Results for orders placed or performed during the hospital encounter of 10/29/14  Blood culture (routine x 2)     Status: None (Preliminary result)   Collection Time: 10/29/14 10:56 PM  Result Value Ref Range Status   Specimen Description BLOOD LEFT WRIST  Final   Special Requests   Final    BOTTLES DRAWN AEROBIC AND ANAEROBIC 1CCANAEROBIC, 2CCAEROBIC   Culture NO GROWTH 3 DAYS  Final  Report Status PENDING  Incomplete  Blood culture (routine x 2)     Status: None (Preliminary result)   Collection Time: 10/29/14 10:56 PM  Result Value Ref Range Status   Specimen Description BLOOD RIGHT ASSIST CONTROL  Final   Special Requests BOTTLES DRAWN AEROBIC AND ANAEROBIC [place]  Final   Culture NO GROWTH 3 DAYS  Final   Report Status PENDING  Incomplete  Urine culture     Status: None   Collection Time: 10/29/14 10:56 PM  Result Value Ref Range Status   Specimen Description URINE, RANDOM  Final   Special Requests NONE  Final   Culture NO GROWTH 2 DAYS  Final   Report Status 10/31/2014 FINAL  Final    RADIOLOGY:  No results found.      Management plans discussed with the patient, family and they are in agreement.  CODE STATUS: Full code  TOTAL TIME TAKING CARE OF THIS PATIENT: 35 minutes.    Demetrios Loll M.D on 11/01/2014 at 3:32 PM  Between 7am to 6pm - Pager - [phone]  After 6pm go to [email or web address] - password [email or web address] [facility]  Office  [phone]  CC: Primary care physician; Albina Billet, MD

## 2014-11-04 LAB — CULTURE, BLOOD (ROUTINE X 2)
Culture: NO GROWTH
Culture: NO GROWTH

## 2014-11-05 NOTE — Care Management (Signed)
Post discharge: Notified by Vaughan Basta at [facility] that they still need discharge summary. I have attempted to fax to [facility] 4 times and line is busy.

## 2015-01-07 DIAGNOSIS — I639 Cerebral infarction, unspecified: Secondary | ICD-10-CM

## 2015-01-07 HISTORY — DX: Cerebral infarction, unspecified: I63.9

## 2015-02-20 ENCOUNTER — Emergency Department: Payer: Medicare Other

## 2015-02-20 ENCOUNTER — Inpatient Hospital Stay
Admission: EM | Admit: 2015-02-20 | Discharge: 2015-02-28 | DRG: 065 | Disposition: A | Discharge status: Skilled Nursing Facility | Payer: Medicare Other | Attending: Internal Medicine | Admitting: Internal Medicine

## 2015-02-20 DIAGNOSIS — E46 Unspecified protein-calorie malnutrition: Secondary | ICD-10-CM | POA: Diagnosis present

## 2015-02-20 DIAGNOSIS — I248 Other forms of acute ischemic heart disease: Secondary | ICD-10-CM | POA: Diagnosis present

## 2015-02-20 DIAGNOSIS — N179 Acute kidney failure, unspecified: Secondary | ICD-10-CM | POA: Diagnosis present

## 2015-02-20 DIAGNOSIS — Z6824 Body mass index (BMI) 24.0-24.9, adult: Secondary | ICD-10-CM | POA: Diagnosis not present

## 2015-02-20 DIAGNOSIS — R1310 Dysphagia, unspecified: Secondary | ICD-10-CM | POA: Diagnosis present

## 2015-02-20 DIAGNOSIS — I63412 Cerebral infarction due to embolism of left middle cerebral artery: Secondary | ICD-10-CM | POA: Diagnosis present

## 2015-02-20 DIAGNOSIS — I639 Cerebral infarction, unspecified: Secondary | ICD-10-CM | POA: Diagnosis present

## 2015-02-20 DIAGNOSIS — Z79899 Other long term (current) drug therapy: Secondary | ICD-10-CM | POA: Diagnosis not present

## 2015-02-20 DIAGNOSIS — F39 Unspecified mood [affective] disorder: Secondary | ICD-10-CM | POA: Diagnosis present

## 2015-02-20 DIAGNOSIS — Z888 Allergy status to other drugs, medicaments and biological substances status: Secondary | ICD-10-CM

## 2015-02-20 DIAGNOSIS — D649 Anemia, unspecified: Secondary | ICD-10-CM | POA: Diagnosis present

## 2015-02-20 DIAGNOSIS — Z96652 Presence of left artificial knee joint: Secondary | ICD-10-CM | POA: Diagnosis present

## 2015-02-20 DIAGNOSIS — Z515 Encounter for palliative care: Secondary | ICD-10-CM | POA: Diagnosis present

## 2015-02-20 DIAGNOSIS — I6529 Occlusion and stenosis of unspecified carotid artery: Secondary | ICD-10-CM | POA: Insufficient documentation

## 2015-02-20 DIAGNOSIS — E872 Acidosis: Secondary | ICD-10-CM | POA: Diagnosis present

## 2015-02-20 DIAGNOSIS — D638 Anemia in other chronic diseases classified elsewhere: Secondary | ICD-10-CM | POA: Diagnosis present

## 2015-02-20 DIAGNOSIS — I499 Cardiac arrhythmia, unspecified: Secondary | ICD-10-CM | POA: Insufficient documentation

## 2015-02-20 DIAGNOSIS — Z8551 Personal history of malignant neoplasm of bladder: Secondary | ICD-10-CM

## 2015-02-20 DIAGNOSIS — R4701 Aphasia: Secondary | ICD-10-CM | POA: Diagnosis present

## 2015-02-20 DIAGNOSIS — Z8673 Personal history of transient ischemic attack (TIA), and cerebral infarction without residual deficits: Secondary | ICD-10-CM | POA: Insufficient documentation

## 2015-02-20 DIAGNOSIS — I441 Atrioventricular block, second degree: Secondary | ICD-10-CM | POA: Diagnosis present

## 2015-02-20 DIAGNOSIS — R451 Restlessness and agitation: Secondary | ICD-10-CM | POA: Diagnosis present

## 2015-02-20 DIAGNOSIS — R410 Disorientation, unspecified: Secondary | ICD-10-CM | POA: Diagnosis present

## 2015-02-20 DIAGNOSIS — R001 Bradycardia, unspecified: Secondary | ICD-10-CM | POA: Diagnosis present

## 2015-02-20 DIAGNOSIS — Z8546 Personal history of malignant neoplasm of prostate: Secondary | ICD-10-CM | POA: Diagnosis not present

## 2015-02-20 DIAGNOSIS — F0150 Vascular dementia without behavioral disturbance: Secondary | ICD-10-CM | POA: Diagnosis present

## 2015-02-20 DIAGNOSIS — R531 Weakness: Secondary | ICD-10-CM | POA: Diagnosis not present

## 2015-02-20 DIAGNOSIS — N184 Chronic kidney disease, stage 4 (severe): Secondary | ICD-10-CM | POA: Diagnosis present

## 2015-02-20 DIAGNOSIS — K219 Gastro-esophageal reflux disease without esophagitis: Secondary | ICD-10-CM | POA: Diagnosis present

## 2015-02-20 DIAGNOSIS — E785 Hyperlipidemia, unspecified: Secondary | ICD-10-CM | POA: Diagnosis present

## 2015-02-20 DIAGNOSIS — I129 Hypertensive chronic kidney disease with stage 1 through stage 4 chronic kidney disease, or unspecified chronic kidney disease: Secondary | ICD-10-CM | POA: Diagnosis present

## 2015-02-20 DIAGNOSIS — I69392 Facial weakness following cerebral infarction: Secondary | ICD-10-CM | POA: Diagnosis not present

## 2015-02-20 DIAGNOSIS — R4702 Dysphasia: Secondary | ICD-10-CM | POA: Diagnosis present

## 2015-02-20 DIAGNOSIS — I69320 Aphasia following cerebral infarction: Secondary | ICD-10-CM | POA: Diagnosis not present

## 2015-02-20 DIAGNOSIS — I6523 Occlusion and stenosis of bilateral carotid arteries: Secondary | ICD-10-CM | POA: Diagnosis not present

## 2015-02-20 HISTORY — DX: Cerebral infarction, unspecified: I63.9

## 2015-02-20 LAB — LIPID PANEL
CHOLESTEROL: 167 mg/dL (ref 0–200)
HDL: 33 mg/dL — ABNORMAL LOW (ref >40)
LDL Cholesterol: 104 mg/dL — ABNORMAL HIGH (ref 0–99)
Total CHOL/HDL Ratio: 5.1 RATIO
Triglycerides: 148 mg/dL (ref <150)
VLDL: 30 mg/dL (ref 0–40)

## 2015-02-20 LAB — DIFFERENTIAL
BASOS ABS: 0.1 10*3/uL (ref 0–0.1)
Basophils Relative: 1 %
EOS PCT: 6 %
Eosinophils Absolute: 0.6 10*3/uL (ref 0–0.7)
LYMPHS PCT: 22 %
Lymphs Abs: 2.2 10*3/uL (ref 1.0–3.6)
Monocytes Absolute: 0.8 10*3/uL (ref 0.2–1.0)
Monocytes Relative: 8 %
NEUTROS PCT: 63 %
Neutro Abs: 6.3 10*3/uL (ref 1.4–6.5)

## 2015-02-20 LAB — COMPREHENSIVE METABOLIC PANEL
ALBUMIN: 3 g/dL — AB (ref 3.5–5.0)
ALK PHOS: 127 U/L — AB (ref 38–126)
ALT: 81 U/L — ABNORMAL HIGH (ref 17–63)
ANION GAP: 7 (ref 5–15)
AST: 78 U/L — AB (ref 15–41)
BUN: 41 mg/dL — ABNORMAL HIGH (ref 6–20)
CALCIUM: 8.6 mg/dL — AB (ref 8.9–10.3)
CO2: 20 mmol/L — AB (ref 22–32)
Chloride: 113 mmol/L — ABNORMAL HIGH (ref 101–111)
Creatinine, Ser: 1.94 mg/dL — ABNORMAL HIGH (ref 0.61–1.24)
GFR calc Af Amer: 34 mL/min — ABNORMAL LOW (ref >60)
GFR calc non Af Amer: 30 mL/min — ABNORMAL LOW (ref >60)
Glucose, Bld: 107 mg/dL — ABNORMAL HIGH (ref 65–99)
POTASSIUM: 4.6 mmol/L (ref 3.5–5.1)
SODIUM: 140 mmol/L (ref 135–145)
TOTAL PROTEIN: 6.9 g/dL (ref 6.5–8.1)
Total Bilirubin: 0.4 mg/dL (ref 0.3–1.2)

## 2015-02-20 LAB — CBC
HCT: 37 % — ABNORMAL LOW (ref 40.0–52.0)
HEMOGLOBIN: 12.2 g/dL — AB (ref 13.0–18.0)
MCH: 28.1 pg (ref 26.0–34.0)
MCHC: 33.1 g/dL (ref 32.0–36.0)
MCV: 85.1 fL (ref 80.0–100.0)
PLATELETS: 287 10*3/uL (ref 150–440)
RBC: 4.35 MIL/uL — AB (ref 4.40–5.90)
RDW: 14.9 % — ABNORMAL HIGH (ref 11.5–14.5)
WBC: 9.9 10*3/uL (ref 3.8–10.6)

## 2015-02-20 LAB — GLUCOSE, CAPILLARY: GLUCOSE-CAPILLARY: 107 mg/dL — AB (ref 65–99)

## 2015-02-20 LAB — PROTIME-INR
INR: 1.05
PROTHROMBIN TIME: 13.9 s (ref 11.4–15.0)

## 2015-02-20 LAB — APTT: APTT: 27 s (ref 24–36)

## 2015-02-20 MED ORDER — ASPIRIN 300 MG RE SUPP
300.0000 mg | Freq: Once | RECTAL | Status: AC
  Administered 2015-02-20: 300 mg via RECTAL
  Filled 2015-02-20: qty 1

## 2015-02-20 MED ORDER — ATORVASTATIN CALCIUM 20 MG PO TABS: 40.0000 mg | ORAL_TABLET | Freq: Every day | ORAL | Status: DC

## 2015-02-20 MED ORDER — ASPIRIN 325 MG PO TABS: 325.0000 mg | ORAL_TABLET | Freq: Every day | ORAL | Status: DC

## 2015-02-20 NOTE — H&P (Signed)
[facility] [place] NAME: Tyler Hale    MR#:  [id number]  DATE OF BIRTH:  [birth year]  DATE OF ADMISSION:  02/20/2015  PRIMARY CARE PHYSICIAN: Albina Billet, MD   REQUESTING/REFERRING PHYSICIAN: Marcelene Butte  CHIEF COMPLAINT:   Chief Complaint  Patient presents with  . Cerebrovascular Accident    HISTORY OF PRESENT ILLNESS: Tyler Hale  is a 81 y.o. male with a known history of gastric reflux, bladder cancer, prostate cancer, arthritis, recent stroke in October 2016 was admitted at [facility]- as the stroke happened near there and was taken over there he stayed in the hospital for a few days, then was discharged to decrease versus for further rehabilitation. As per wife daughter and son who are present in the room, getting the last stroke he suffered from speech problem and right upper extremity weakness. He had a gradual recovery in his speech up to the level he was able to speak 3-4 word sentences and was able to come any get an answer to questions. But he was not able to move his right upper extremity. Yesterday at nursing home he stayed sleepy most of the time and did not get up for physical therapy also wife also noticed his face is slightly deviated and looks weak on his right side, she drew attention of the nurses towards this, they spoke to the doctor on-call and as per them he started on some kind of blood thinner. Today morning when they tried to get him up for physical therapy he was not able to maintain his balance, and his right leg was very very weak. He also had lost his speech today again. As per family he never had any weakness on his lower extremities and was able to walk fine up until day before yesterday. As per family because of his swallowing difficulty they're giving his aspirin and other medications crushed in applesauce and he was taking them regularly. Patient is not able to understand workup indicated with me  currently because of speech issue so all this history obtained from his wife son and daughter who were present in the room and I reviewed the records available in our system from [facility] admission last month.  PAST MEDICAL HISTORY:   Past Medical History  Diagnosis Date  . GERD (gastroesophageal reflux disease)   . Brain injury ([place]) 06/07/2012  . Cancer [facility]) 2004    bladder  . Cancer [facility])     prostate  . Arthritis   . Stroke [facility])     PAST SURGICAL HISTORY:  Past Surgical History  Procedure Laterality Date  . Leg surgery Bilateral 2014  . Joint replacement Left     Total knee Replacement  . Hip pinning Left 1984    hit by tractor  . Fracture surgery Left     hip, jaw  . Green light laser turp (transurethral resection of prostate N/A 10/29/2014    Procedure: GREEN LIGHT LASER TURP (TRANSURETHRAL RESECTION OF PROSTATE;  Surgeon: Royston Cowper, MD;  Location: [facility] ORS;  Service: Urology;  Laterality: N/A;    SOCIAL HISTORY:  Social History  Substance Use Topics  . Smoking status: Never Smoker   . Smokeless tobacco: Never Used  . Alcohol Use: No    FAMILY HISTORY:  Family History  Problem Relation Age of Onset  . Prostate cancer Brother   . Cancer - Other Brother     Liver    DRUG ALLERGIES:  Allergies  Allergen Reactions  . Prednisone Other (See Comments)    "blisters"  . Vioxx [Rofecoxib] Other (See Comments)    "bleeding of stomach"    REVIEW OF SYSTEMS:   Patient is not able to give me review of systems because of loss of speech and loss of ability to understand her communicate.  MEDICATIONS AT HOME:  Prior to Admission medications   Medication Sig Start Date End Date Taking? Authorizing Provider  acetaminophen-codeine (TYLENOL #3) 300-30 MG per tablet Take 1-2 tablets by mouth every 4 (four) hours as needed for moderate pain. 10/29/14   Royston Cowper, MD  docusate sodium (COLACE) 100 MG capsule Take 2 capsules (200 mg total) by mouth 2 (two) times daily.  10/29/14   Royston Cowper, MD  levofloxacin (LEVAQUIN) 500 MG tablet Take 1 tablet (500 mg total) by mouth daily. 10/29/14   Royston Cowper, MD  Meth-Hyo-M [place]ett Hatter Phos-Ph Sal (URIBEL) 118 MG CAPS Take 1 capsule (118 mg total) by mouth every 6 (six) hours as needed (dysuria). 10/29/14   Royston Cowper, MD  omeprazole (PRILOSEC) 20 MG capsule Take 20 mg by mouth daily.    Historical Provider, MD  ondansetron (ZOFRAN ODT) 8 MG disintegrating tablet Take 1 tablet (8 mg total) by mouth every 6 (six) hours as needed for nausea or vomiting. 10/29/14   Royston Cowper, MD  vitamin B-12 (CYANOCOBALAMIN) 1000 MCG tablet Take 1,000 mcg by mouth daily.    Historical Provider, MD      PHYSICAL EXAMINATION:   VITAL SIGNS: Blood pressure 147/85, pulse 101, temperature 97.3 F (36.3 C), resp. rate 16, height 5\' 10"  (1.778 m), weight 77.202 kg (170 lb 3.2 oz), SpO2 99 %.  GENERAL:  81 y.o.-year-old patient lying in the bed with no acute distress.  EYES: Pupils equal, round, reactive to light and accommodation. No scleral icterus. Extraocular muscles intact.  HEENT: Head atraumatic, normocephalic. Oropharynx and nasopharynx clear.  NECK:  Supple, no jugular venous distention. No thyroid enlargement, no tenderness.  LUNGS: Normal breath sounds bilaterally, no wheezing, rales,rhonchi or crepitation. No use of accessory muscles of respiration.  CARDIOVASCULAR: S1, S2 normal. No murmurs, rubs, or gallops.  ABDOMEN: Soft, nontender, nondistended. Bowel sounds present. No organomegaly or mass.  EXTREMITIES: No pedal edema, cyanosis, or clubbing.  NEUROLOGIC: Face appears weak on the right side, right upper extremity 0 out of 5, right lower extremity 3 out of 5 , left side of her lower extremity power 5 out of 5  PSYCHIATRIC: The patient is alert but not oriented, not able to communicate.  SKIN: No obvious rash, lesion, or ulcer.   LABORATORY PANEL:   CBC  Recent Labs Lab 02/20/15 1804  WBC 9.9  HGB 12.2*  HCT  37.0*  PLT 287  MCV 85.1  MCH 28.1  MCHC 33.1  RDW 14.9*  LYMPHSABS 2.2  MONOABS 0.8  EOSABS 0.6  BASOSABS 0.1   ------------------------------------------------------------------------------------------------------------------  Chemistries   Recent Labs Lab 02/20/15 1804  NA 140  K 4.6  CL 113*  CO2 20*  GLUCOSE 107*  BUN 41*  CREATININE 1.94*  CALCIUM 8.6*  AST 78*  ALT 81*  ALKPHOS 127*  BILITOT 0.4   ------------------------------------------------------------------------------------------------------------------ estimated creatinine clearance is 28.2 mL/min (by C-G formula based on Cr of 1.94). ------------------------------------------------------------------------------------------------------------------ No results for input(s): TSH, T4TOTAL, T3FREE, THYROIDAB in the last 72 hours.  Invalid input(s): FREET3   Coagulation profile  Recent Labs Lab 02/20/15 1804  INR 1.05   -------------------------------------------------------------------------------------------------------------------  No results for input(s): DDIMER in the last 72 hours. -------------------------------------------------------------------------------------------------------------------  Cardiac Enzymes No results for input(s): CKMB, TROPONINI, MYOGLOBIN in the last 168 hours.  Invalid input(s): CK ------------------------------------------------------------------------------------------------------------------ Invalid input(s): POCBNP  ---------------------------------------------------------------------------------------------------------------  Urinalysis    Component Value Date/Time   COLORURINE RED* 10/29/2014 2256   APPEARANCEUR CLOUDY* 10/29/2014 2256   LABSPEC 1.011 10/29/2014 2256   PHURINE 6.0 10/29/2014 2256   GLUCOSEU NEGATIVE 10/29/2014 2256   HGBUR 3+* 10/29/2014 2256   BILIRUBINUR NEGATIVE 10/29/2014 2256   KETONESUR NEGATIVE 10/29/2014 2256   PROTEINUR 100*  10/29/2014 2256   NITRITE NEGATIVE 10/29/2014 2256   LEUKOCYTESUR NEGATIVE 10/29/2014 2256     RADIOLOGY: Ct Head Wo Contrast  02/20/2015  CLINICAL DATA:  Left-sided weakness and fall yesterday. Altered mental status. Stroke. EXAM: CT HEAD WITHOUT CONTRAST TECHNIQUE: Contiguous axial images were obtained from the base of the skull through the vertex without intravenous contrast. COMPARISON:  None. FINDINGS: Cytotoxic edema is seen within the left parietal lobe throughout the MCA distribution, consistent with acute left middle cerebral artery distribution infarct. No evidence of hemorrhage, mass effect, or midline shift. No abnormal extra-axial fluid collections identified. Mild diffuse cerebral atrophy noted as well as old left fall basal ganglia lacunar infarct. Mild chronic small vessel disease also noted. No evidence of skull fracture. IMPRESSION: Acute large left parietal MCA distribution cerebral infarct. No evidence of associated hemorrhage, mass effect, or midline shift. Mild cerebral atrophy, chronic small vessel disease, and old left basal ganglia lacunar infarct. These results were called by telephone at the time of interpretation on 02/20/2015 at 6:31 pm to Dr. Kerman Passey, who verbally acknowledged these results. Electronically Signed   By: Earle Gell M.D.   On: 02/20/2015 18:34    IMPRESSION AND PLAN: * Acute stroke  As per CT scan there is a massive left MCA stroke.  I reviewed in our system patient's reports from [facility] admission last month, it shows on MRI on 24th of October he had left MCA territory multiple embolic strokes. But his echocardiogram did not show any signs of atrioventricular septal defect on bubble study. 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 He was discharged on aspirin. And was recovering in his speech with therapy. Family is upset about the care at [facility] he was getting.  We  will admit and monitor him on cardiac monitor and neurochecks.  I will do MR angiogram of brain and neck to check the source of his repeated strokes.  Get neurology consult.  Currently give aspirin suppository as he will not be able to swallow. Later we need to get speech and swallow evaluation. Check his lipid panel. Need physical therapy and occupational therapy evaluation.  Family is requesting a cd off the radiological test done at [facility] last month, try to request them tomorrow.  * Hypertension  Currently due to acute stroke we will allow permissive hypertension off systolic pressure up to [id number].  * Chronic renal failure  Stable continue monitoring.  All the records are reviewed and case discussed with ED provider. Management plans discussed with the patient, family and they are in agreement.  CODE STATUS: Full code   TOTAL TIME TAKING CARE OF THIS PATIENT: 50 minutes.  I discussed the findings and plan with patient's wife and son and daughter who were present in the room.  Vaughan Basta M.D on 02/20/2015   Between 7am to 6pm - Pager -  [phone]  After 6pm go to [email or web address] - password [email or web address] [facility]  Office  [phone]  CC: Primary care physician; Albina Billet, MD   Note: This dictation was prepared with Dragon dictation along with smaller phrase technology. Any transcriptional errors that result from this process are unintentional.

## 2015-02-20 NOTE — ED Notes (Addendum)
Spoke Faith at [facility] in regards to patient's blood thinner.  Informed the patient does not take any blood thinner except a 81 mg aspirin EC.  Patient was also recently started on Flagyl on 11/23. MD notified.

## 2015-02-20 NOTE — ED Notes (Signed)
Admitting MD at bedside.

## 2015-02-20 NOTE — ED Notes (Addendum)
Pt arrived via EMS from [facility]. Pt family noticed yesterday morning after breakfast a right side droop, that went away. Today family stated pt is not speaking, walking, or acting like himself.  EMS reports rt pupil pinpoint, lf 44mm, BP 169/78, P 88, O2 97%, RR16.

## 2015-02-20 NOTE — ED Provider Notes (Signed)
Time Seen: Approximately 1810  I have reviewed the triage notes  Chief Complaint: Cerebrovascular Accident   History of Present Illness: Tyler Hale is a 81 y.o. male who presents via EMS from a local rehabilitation center. Patient has a known history of a previous stroke who was seen and evaluated and admitted and worked up at [facility] in [place]. [facility]. His wife states that he was showing improvement especially with his speech. Patient started having some facial weakness yesterday and then was noted today to have dense weakness mainly in the right upper and right lower extremity. Patient's had difficulty with speech. He denies any headaches is difficult for him to answer questions due to appears to be dense aphasia. He states this was present when he had his previous stroke. Medication review shows that he is on aspirin but no other medication. Past Medical History  Diagnosis Date  . GERD (gastroesophageal reflux disease)   . Brain injury ([place]) 06/07/2012  . Cancer [facility]) 2004    bladder  . Cancer [facility])     prostate  . Arthritis   . Stroke [facility])     Patient Active Problem List   Diagnosis Date Noted  . Stroke (cerebrum) ([place]) 02/20/2015  . Altered mental status 10/31/2014  . Confusion 10/30/2014  . Esophageal reflux 11/06/2012    Past Surgical History  Procedure Laterality Date  . Leg surgery Bilateral 2014  . Joint replacement Left     Total knee Replacement  . Hip pinning Left 1984    hit by tractor  . Fracture surgery Left     hip, jaw  . Green light laser turp (transurethral resection of prostate N/A 10/29/2014    Procedure: GREEN LIGHT LASER TURP (TRANSURETHRAL RESECTION OF PROSTATE;  Surgeon: Royston Cowper, MD;  Location: [facility] ORS;  Service: Urology;  Laterality: N/A;    Past Surgical History  Procedure Laterality Date  . Leg surgery Bilateral 2014  . Joint replacement Left     Total knee Replacement   . Hip pinning Left 1984    hit by tractor  . Fracture surgery Left     hip, jaw  . Green light laser turp (transurethral resection of prostate N/A 10/29/2014    Procedure: GREEN LIGHT LASER TURP (TRANSURETHRAL RESECTION OF PROSTATE;  Surgeon: Royston Cowper, MD;  Location: [facility] ORS;  Service: Urology;  Laterality: N/A;    Current Outpatient Rx  Name  Route  Sig  Dispense  Refill  . acetaminophen-codeine (TYLENOL #3) 300-30 MG per tablet   Oral   Take 1-2 tablets by mouth every 4 (four) hours as needed for moderate pain.   30 tablet   2   . docusate sodium (COLACE) 100 MG capsule   Oral   Take 2 capsules (200 mg total) by mouth 2 (two) times daily.   120 capsule   3   . levofloxacin (LEVAQUIN) 500 MG tablet   Oral   Take 1 tablet (500 mg total) by mouth daily.   7 tablet   0   . Meth-Hyo-M Bl-Na Phos-Ph Sal (URIBEL) 118 MG CAPS   Oral   Take 1 capsule (118 mg total) by mouth every 6 (six) hours as needed (dysuria).   40 capsule   3     Dispense as written.   Marland Kitchen omeprazole (PRILOSEC) 20 MG capsule   Oral   Take 20 mg by mouth daily.         Marland Kitchen  ondansetron (ZOFRAN ODT) 8 MG disintegrating tablet   Oral   Take 1 tablet (8 mg total) by mouth every 6 (six) hours as needed for nausea or vomiting.   10 tablet   3   . vitamin B-12 (CYANOCOBALAMIN) 1000 MCG tablet   Oral   Take 1,000 mcg by mouth daily.           Allergies:  Prednisone and Vioxx  Family History: Family History  Problem Relation Age of Onset  . Prostate cancer Brother   . Cancer - Other Brother     Liver    Social History: Social History  Substance Use Topics  . Smoking status: Never Smoker   . Smokeless tobacco: Never Used  . Alcohol Use: No     Review of Systems:   10 point review of systems was performed and was otherwise negative: Review of systems was acquired from the family and also medical record Constitutional: No fever Eyes: No visual disturbances ENT: No sore throat,  ear pain Cardiac: No chest pain Respiratory: No shortness of breath, wheezing, or stridor Abdomen: No abdominal pain, no vomiting, No diarrhea Endocrine: No weight loss, No night sweats Extremities: No peripheral edema, cyanosis Skin: No rashes, easy bruising Neurologic: Focal weakness mentioned above Urologic: No dysuria, Hematuria, or urinary frequency   Physical Exam:  ED Triage Vitals  Enc Vitals Group     BP 02/20/15 1804 141/85 mmHg     Pulse Rate 02/20/15 1804 86     Resp 02/20/15 1804 16     Temp 02/20/15 1804 97.3 F (36.3 C)     Temp src --      SpO2 02/20/15 1804 97 %     Weight 02/20/15 1804 170 lb 3.2 oz (77.202 kg)     Height 02/20/15 1804 5\' 10"  (1.778 m)     Head Cir --      Peak Flow --      Pain Score --      Pain Loc --      Pain Edu? --      Excl. in [place]? --     General: Awake , Alert , and Oriented times 3; GCS 15 Head: Normal cephalic , atraumatic Eyes: Pupils equal , round, reactive to light Nose/Throat: No nasal drainage, patent upper airway without erythema or exudate.  Neck: Supple, Full range of motion, No anterior adenopathy or palpable thyroid masses Lungs: Clear to ascultation without wheezes , rhonchi, or rales Heart: Regular rate, regular rhythm without murmurs , gallops , or rubs Abdomen: Soft, non tender without rebound, guarding , or rigidity; bowel sounds positive and symmetric in all 4 quadrants. No organomegaly .        Extremities: 2 plus symmetric pulses. No edema, clubbing or cyanosis Neurologic: normal ambulation, Motor symmetric without deficits, sensory intact Skin: warm, dry, no rashes   Labs:   All laboratory work was reviewed including any pertinent negatives or positives listed below:  Labs Reviewed  CBC - Abnormal; Notable for the following:    RBC 4.35 (*)    Hemoglobin 12.2 (*)    HCT 37.0 (*)    RDW 14.9 (*)    All other components within normal limits  COMPREHENSIVE METABOLIC PANEL - Abnormal; Notable for the  following:    Chloride 113 (*)    CO2 20 (*)    Glucose, Bld 107 (*)    BUN 41 (*)    Creatinine, Ser 1.94 (*)    Calcium 8.6 (*)  Albumin 3.0 (*)    AST 78 (*)    ALT 81 (*)    Alkaline Phosphatase 127 (*)    GFR calc non Af Amer 30 (*)    GFR calc Af Amer 34 (*)    All other components within normal limits  GLUCOSE, CAPILLARY - Abnormal; Notable for the following:    Glucose-Capillary 107 (*)    All other components within normal limits  PROTIME-INR  APTT  DIFFERENTIAL  LIPID PANEL  CBG MONITORING, ED   reviewed all laboratory work showed some consistent renal insufficiency  EKG:  ED ECG REPORT I, Daymon Larsen, the attending physician, personally viewed and interpreted this ECG.  Date: 02/20/2015 EKG Time: 1803 Rate: 111 Rhythm: normal sinus rhythm QRS Axis: normal Intervals: Right bundle-branch block ST/T Wave abnormalities: normal Conduction Disutrbances: none Narrative Interpretation: unremarkable No acute ischemic changes   Radiology:     CLINICAL DATA: Left-sided weakness and fall yesterday. Altered mental status. Stroke.  EXAM: CT HEAD WITHOUT CONTRAST  TECHNIQUE: Contiguous axial images were obtained from the base of the skull through the vertex without intravenous contrast.  COMPARISON: None.  FINDINGS: Cytotoxic edema is seen within the left parietal lobe throughout the MCA distribution, consistent with acute left middle cerebral artery distribution infarct. No evidence of hemorrhage, mass effect, or midline shift. No abnormal extra-axial fluid collections identified.  Mild diffuse cerebral atrophy noted as well as old left fall basal ganglia lacunar infarct. Mild chronic small vessel disease also noted. No evidence of skull fracture.  IMPRESSION: Acute large left parietal MCA distribution cerebral infarct. No evidence of associated hemorrhage, mass effect, or midline shift.  Mild cerebral atrophy, chronic small vessel disease,  and old left basal ganglia lacunar infarct.  These results were called by telephone at the time of interpretation on 02/20/2015 at 6:31 pm to Dr. Kerman Passey, who verbally acknowledged these results.   Electronically Signed By: Earle Gell M.D.  I personally reviewed the radiologic studies    Critical Care:  CRITICAL CARE Performed by: Daymon Larsen   Total critical care time: 31 minutes  Critical care time was exclusive of separately billable procedures and treating other patients.  Critical care was necessary to treat or prevent imminent or life-threatening deterioration.  Critical care was time spent personally by me on the following activities: development of treatment plan with patient and/or surrogate as well as nursing, discussions with consultants, evaluation of patient's response to treatment, examination of patient, obtaining history from patient or surrogate, ordering and performing treatments and interventions, ordering and review of laboratory studies, ordering and review of radiographic studies, pulse oximetry and re-evaluation of patient's condition. Critical care mainly initial evaluation of acute cerebrovascular accident with family and specialist conversation.    ED Course: Acute cerebrovascular accident    Final Clinical Impression:   Final diagnoses:  Acute cerebrovascular accident (CVA) [facility])     Plan:  Inpatient management Patient's case was reviewed with the hospitalist team, further disposition and management depends upon her evaluation           Daymon Larsen, MD 02/20/15 2114

## 2015-02-21 ENCOUNTER — Inpatient Hospital Stay: Payer: Medicare Other

## 2015-02-21 LAB — MRSA PCR SCREENING: MRSA by PCR: POSITIVE — AB

## 2015-02-21 LAB — BASIC METABOLIC PANEL
Anion gap: 5 (ref 5–15)
BUN: 36 mg/dL — AB (ref 6–20)
CHLORIDE: 116 mmol/L — AB (ref 101–111)
CO2: 21 mmol/L — ABNORMAL LOW (ref 22–32)
CREATININE: 1.75 mg/dL — AB (ref 0.61–1.24)
Calcium: 8.5 mg/dL — ABNORMAL LOW (ref 8.9–10.3)
GFR calc Af Amer: 39 mL/min — ABNORMAL LOW (ref >60)
GFR, EST NON AFRICAN AMERICAN: 34 mL/min — AB (ref >60)
Glucose, Bld: 98 mg/dL (ref 65–99)
Potassium: 4.8 mmol/L (ref 3.5–5.1)
SODIUM: 142 mmol/L (ref 135–145)

## 2015-02-21 LAB — CBC
HEMATOCRIT: 34.1 % — AB (ref 40.0–52.0)
HEMOGLOBIN: 11.3 g/dL — AB (ref 13.0–18.0)
MCH: 27.9 pg (ref 26.0–34.0)
MCHC: 33.1 g/dL (ref 32.0–36.0)
MCV: 84.4 fL (ref 80.0–100.0)
Platelets: 264 10*3/uL (ref 150–440)
RBC: 4.04 MIL/uL — ABNORMAL LOW (ref 4.40–5.90)
RDW: 14.4 % (ref 11.5–14.5)
WBC: 9.6 10*3/uL (ref 3.8–10.6)

## 2015-02-21 LAB — BLOOD GAS, VENOUS
ACID-BASE DEFICIT: 4.9 mmol/L — AB (ref 0.0–2.0)
Bicarbonate: 18.2 mEq/L — ABNORMAL LOW (ref 21.0–28.0)
PATIENT TEMPERATURE: 37
PCO2 VEN: 28 mmHg — AB (ref 44.0–60.0)
PH VEN: 7.42 (ref 7.320–7.430)

## 2015-02-21 MED ORDER — CHLORHEXIDINE GLUCONATE CLOTH 2 % EX PADS
6.0000 | MEDICATED_PAD | Freq: Every day | CUTANEOUS | Status: AC
  Administered 2015-02-21 – 2015-02-25 (×4): 6 via TOPICAL

## 2015-02-21 MED ORDER — LORAZEPAM 2 MG/ML IJ SOLN
INTRAMUSCULAR | Status: AC
  Filled 2015-02-21: qty 1

## 2015-02-21 MED ORDER — LORAZEPAM 2 MG/ML IJ SOLN: 1.0000 mg | Freq: Once | INTRAMUSCULAR | Status: DC

## 2015-02-21 MED ORDER — MUPIROCIN 2 % EX OINT
1.0000 "application " | TOPICAL_OINTMENT | Freq: Two times a day (BID) | CUTANEOUS | Status: AC
  Administered 2015-02-21 – 2015-02-25 (×8): 1 via NASAL
  Filled 2015-02-21 (×2): qty 22

## 2015-02-21 MED ORDER — HALOPERIDOL LACTATE 5 MG/ML IJ SOLN
1.0000 mg | Freq: Four times a day (QID) | INTRAMUSCULAR | Status: DC | PRN
  Administered 2015-02-21: 16:00:00 1 mg via INTRAVENOUS
  Filled 2015-02-21: qty 1

## 2015-02-21 MED ORDER — ASPIRIN 300 MG RE SUPP
300.0000 mg | Freq: Every day | RECTAL | Status: DC
  Administered 2015-02-21 – 2015-02-22 (×2): 300 mg via RECTAL
  Filled 2015-02-21 (×3): qty 1

## 2015-02-21 MED ORDER — DEXTROSE-NACL 5-0.45 % IV SOLN
INTRAVENOUS | Status: DC
  Administered 2015-02-21: 19:00:00 via INTRAVENOUS

## 2015-02-21 MED ORDER — LORAZEPAM 2 MG/ML IJ SOLN
1.0000 mg | Freq: Once | INTRAMUSCULAR | Status: AC
  Administered 2015-02-21: 1 mg via INTRAMUSCULAR

## 2015-02-21 MED ORDER — SODIUM CHLORIDE 0.9 % IV SOLN
INTRAVENOUS | Status: DC
  Administered 2015-02-21: 11:00:00 via INTRAVENOUS

## 2015-02-21 NOTE — Progress Notes (Signed)
[facility] [place] NAME: Tyler Hale    MR#:  [id number]  DATE OF BIRTH:  1926-09-04  SUBJECTIVE:  CHIEF COMPLAINT:   Chief Complaint  Patient presents with  . Cerebrovascular Accident   patient is a 81 year old Caucasian male with history of recent stroke which left him with right upper extremity weakness and receptive as well as his expressive aphasia presents to the hospital with worsening right-sided weakness now in the right lower extremity. CT scan of the head revealed left parietal MCA distribution large stroke which was described as acute. MRI of brain is pending. Patient is able to move her right lower extremity today, right upper extremity is flaccid, no review of systems due to aphasia  Review of Systems  Unable to perform ROS: language    VITAL SIGNS: Blood pressure 143/92, pulse 65, temperature 97.5 F (36.4 C), temperature source Oral, resp. rate 20, height 5\' 10"  (1.778 m), weight 77.52 kg (170 lb 14.4 oz), SpO2 98 %.  PHYSICAL EXAMINATION:   GENERAL:  81 y.o.-year-old patient lying in the bed with no acute distress.  EYES: Pupils equal, round, reactive to light and accommodation. No scleral icterus. Extraocular muscles intact.  HEENT: Head atraumatic, normocephalic. Oropharynx and nasopharynx clear.  NECK:  Supple, no jugular venous distention. No thyroid enlargement, no tenderness.  LUNGS: Normal breath sounds bilaterally, no wheezing, rales,rhonchi or crepitation. No use of accessory muscles of respiration.  CARDIOVASCULAR: S1, S2 normal. No murmurs, rubs, or gallops.  ABDOMEN: Soft, nontender, nondistended. Bowel sounds present. No organomegaly or mass.  EXTREMITIES: No pedal edema, cyanosis, or clubbing.  NEUROLOGIC: Cranial nerves II through XII are intact. Muscle strength 0/5 in the right upper extremity, 4 out of 5 in right lower extremity, 5 out of 5 in left upper as well as lower extremities. Sensation not  able to evaluate. Gait not checked.  PSYCHIATRIC: The patient is alert and oriented x 3.  SKIN: No obvious rash, lesion, or ulcer.   ORDERS/RESULTS REVIEWED:   CBC  Recent Labs Lab 02/20/15 1804 02/21/15 0636  WBC 9.9 9.6  HGB 12.2* 11.3*  HCT 37.0* 34.1*  PLT 287 264  MCV 85.1 84.4  MCH 28.1 27.9  MCHC 33.1 33.1  RDW 14.9* 14.4  LYMPHSABS 2.2  --   MONOABS 0.8  --   EOSABS 0.6  --   BASOSABS 0.1  --    ------------------------------------------------------------------------------------------------------------------  Chemistries   Recent Labs Lab 02/20/15 1804 02/21/15 0636  NA 140 142  K 4.6 4.8  CL 113* 116*  CO2 20* 21*  GLUCOSE 107* 98  BUN 41* 36*  CREATININE 1.94* 1.75*  CALCIUM 8.6* 8.5*  AST 78*  --   ALT 81*  --   ALKPHOS 127*  --   BILITOT 0.4  --    ------------------------------------------------------------------------------------------------------------------ estimated creatinine clearance is 31.3 mL/min (by C-G formula based on Cr of 1.75). ------------------------------------------------------------------------------------------------------------------ No results for input(s): TSH, T4TOTAL, T3FREE, THYROIDAB in the last 72 hours.  Invalid input(s): FREET3  Cardiac Enzymes No results for input(s): CKMB, TROPONINI, MYOGLOBIN in the last 168 hours.  Invalid input(s): CK ------------------------------------------------------------------------------------------------------------------ Invalid input(s): POCBNP ---------------------------------------------------------------------------------------------------------------  RADIOLOGY: Ct Head Wo Contrast  02/20/2015  CLINICAL DATA:  Left-sided weakness and fall yesterday. Altered mental status. Stroke. EXAM: CT HEAD WITHOUT CONTRAST TECHNIQUE: Contiguous axial images were obtained from the base of the skull through the vertex without intravenous contrast. COMPARISON:  None. FINDINGS: Cytotoxic  edema is seen within the  left parietal lobe throughout the MCA distribution, consistent with acute left middle cerebral artery distribution infarct. No evidence of hemorrhage, mass effect, or midline shift. No abnormal extra-axial fluid collections identified. Mild diffuse cerebral atrophy noted as well as old left fall basal ganglia lacunar infarct. Mild chronic small vessel disease also noted. No evidence of skull fracture. IMPRESSION: Acute large left parietal MCA distribution cerebral infarct. No evidence of associated hemorrhage, mass effect, or midline shift. Mild cerebral atrophy, chronic small vessel disease, and old left basal ganglia lacunar infarct. These results were called by telephone at the time of interpretation on 02/20/2015 at 6:31 pm to Dr. Kerman Passey, who verbally acknowledged these results. Electronically Signed   By: Earle Gell M.D.   On: 02/20/2015 18:34    EKG:  Orders placed or performed during the hospital encounter of 02/20/15  . ED EKG  . ED EKG    ASSESSMENT AND PLAN:  Principal Problem:   Stroke (cerebrum) ([place]) 1. Acute stroke in the left MCA distribution with right-sided weakness, and to be improving right lower extremity weakness. MRI of brain is pending as well as CT angiogram of neck arteries, neurology consultation is pending, continue aspirin therapy rectally. Speech therapist evaluate the patient and recommended nothing by mouth until tomorrow 2. Essential hypertension, seems to be stable. No treatment unless above [id number] systolic 3. Acute on chronic renal failure, stable. His IV fluid administration 4. Acidosis, etiology is unclear, get VBG 5. Anemia, follow with rehydration 6. Dysphagia, continue patient on IV fluids for now. Speech therapist to evaluate him daily   Management plans discussed with the patient, family and they are in agreement.   DRUG ALLERGIES:  Allergies  Allergen Reactions  . Prednisone Other (See Comments)    "blisters"  . Vioxx  [Rofecoxib] Other (See Comments)    "bleeding of stomach"    CODE STATUS:     Code Status Orders        Start     Ordered   02/20/15 2231  Full code   Continuous     02/20/15 2230      TOTAL TIME TAKING CARE OF THIS PATIENT: 40 minutes.    Theodoro Grist M.D on 02/21/2015 at 1:58 PM  Between 7am to 6pm - Pager - [phone]  After 6pm go to [email or web address] - password [email or web address] [place] [facility]  Office  [phone]  CC: Primary care physician; Albina Billet, MD

## 2015-02-21 NOTE — Progress Notes (Signed)
OT Cancellation Note  Patient Details Name: JOESPH NATOLI MRN: [id number] DOB: Nov 02, 1926   Cancelled Treatment:    Reason Eval/Treat Not Completed: Other (comment) (pt being cleaned up by CNA)  Attempted to see patient again for OT evaluation and not available due to being cleaned up and changed by CNA.  Will attempt evaluation tomorrow.   Freeman Spur, OTR/L ascom [phone] 02/21/2015, 1:52 PM

## 2015-02-21 NOTE — Plan of Care (Signed)
Problem: Tissue Perfusion: Goal: Complications of Ischemic Stroke will be minimized. Outcome: Progressing Plan of care, progress to goals. 1. VS every 2 hours for 12 hours, neuro check every 2 hours for 12 hours, NIH every shift. 2. Pt resting quietly most of the night. 3. Unable to follow most commands. Verbalizes yes and no at times. 4. Right arm paralyis, right pupil pin point. 5. Strict npo for failed swallow eval. 6. Positive MRSA swab to nasal canal. Contact isolation observed. Dorna Bloom RN

## 2015-02-21 NOTE — Evaluation (Signed)
Clinical/Bedside Swallow Evaluation Patient Details  Name: Tyler Hale MRN: [id number] Date of Birth: May 30, 1926  Today's Date: 02/21/2015 Time: SLP Start Time (ACUTE ONLY): 0930 SLP Stop Time (ACUTE ONLY): 1030 SLP Time Calculation (min) (ACUTE ONLY): 60 min  Past Medical History:  Past Medical History  Diagnosis Date  . GERD (gastroesophageal reflux disease)   . Brain injury ([place]) 06/07/2012  . Cancer [facility]) 2004    bladder  . Cancer [facility])     prostate  . Arthritis   . Stroke [facility])    Past Surgical History:  Past Surgical History  Procedure Laterality Date  . Leg surgery Bilateral 2014  . Joint replacement Left     Total knee Replacement  . Hip pinning Left 1984    hit by tractor  . Fracture surgery Left     hip, jaw  . Green light laser turp (transurethral resection of prostate N/A 10/29/2014    Procedure: GREEN LIGHT LASER TURP (TRANSURETHRAL RESECTION OF PROSTATE;  Surgeon: Royston Cowper, MD;  Location: [facility] ORS;  Service: Urology;  Laterality: N/A;   HPI:  Tyler Hale is a 81 y.o. male with a known history of gastric reflux, bladder cancer, prostate cancer, arthritis, recent stroke in October 2016 was admitted at [facility]- as the stroke happened near there and was taken over there he stayed in the hospital for a few days, then was discharged to decrease versus for further rehabilitation. As per wife daughter and son who are present in the room, getting the last stroke he suffered from speech problem and right upper extremity weakness. He had a gradual recovery in his speech up to the level he was able to speak 3-4 word sentences and was able to come any get an answer to questions. But he was not able to move his right upper extremity. Yesterday at nursing home he stayed sleepy most of the time and did not get up for physical therapy also wife also noticed his face is slightly deviated and looks weak on his right side, she drew attention of the nurses towards this,  they spoke to the doctor on-call and as per them he started on some kind of blood thinner. Today morning when they tried to get him up for physical therapy he was not able to maintain his balance, and his right leg was very very weak. He also had lost his speech today again. As per family he never had any weakness on his lower extremities and was able to walk fine up until day before yesterday.   Assessment / Plan / Recommendation Clinical Impression  Pt demonstrated moderate oropharyngeal dysphagia c/b oral holding, decreased mastication of solid and overt s/s of aspiration with thin liquids. Pt judged to be moderate risk of aspiration. Pt was initially demonstrated good oropharyngeal swallow with puree consistency and ice chips, however pt then began holding all boluses in mouth and refusing to swallow despite max cues. Pt appeared confused and possibly demonstrating some discoordination in initiating pharyngeal swallow. (Pt's wife did report that pt had been spitting out medications since previous CVA). Pt expectorated all honey boluses, 2/3 puree, 1 solid and 1 tsp of ice chips. Pt demonstrated overt s/s of aspiration with one cup sip of thin. Vocal quality remained clear during trials. Unable to assess laryngeal elevation d/t pt cooperation. Oral mech exam revealed mild right sided facial weakness, lingual ROM and strength appeared grossly WFL. Recommend continue w/NPO at this time. Will provide PO trials at bedside  in hopes of implementing diet. NSG, MD and family in agreement.     Aspiration Risk  Moderate aspiration risk    Diet Recommendation NPO   Supervision: Full supervision/cueing for compensatory strategies Compensations: Minimize environmental distractions;Slow rate;Small sips/bites Postural Changes: Seated upright at 90 degrees    Other  Recommendations Oral Care Recommendations: Oral care BID;Staff/trained caregiver to provide oral care   Follow up Recommendations       Frequency  and Duration min 3x week  2 weeks       Prognosis Prognosis for Safe Diet Advancement: Fair Barriers to Reach Goals: Cognitive deficits;Language deficits;Severity of deficits      Swallow Study   General HPI: Tyler Hale is a 80 y.o. male with a known history of gastric reflux, bladder cancer, prostate cancer, arthritis, recent stroke in October 2016 was admitted at [facility]- as the stroke happened near there and was taken over there he stayed in the hospital for a few days, then was discharged to decrease versus for further rehabilitation. As per wife daughter and son who are present in the room, getting the last stroke he suffered from speech problem and right upper extremity weakness. He had a gradual recovery in his speech up to the level he was able to speak 3-4 word sentences and was able to come any get an answer to questions. But he was not able to move his right upper extremity. Yesterday at nursing home he stayed sleepy most of the time and did not get up for physical therapy also wife also noticed his face is slightly deviated and looks weak on his right side, she drew attention of the nurses towards this, they spoke to the doctor on-call and as per them he started on some kind of blood thinner. Today morning when they tried to get him up for physical therapy he was not able to maintain his balance, and his right leg was very very weak. He also had lost his speech today again. As per family he never had any weakness on his lower extremities and was able to walk fine up until day before yesterday. Type of Study: Bedside Swallow Evaluation Previous Swallow Assessment: None reported Diet Prior to this Study: NPO Temperature Spikes Noted: N/A Respiratory Status: Room air History of Recent Intubation: No Behavior/Cognition: Alert;Confused;Distractible;Requires cueing Oral Cavity Assessment: Within Functional Limits Oral Care Completed by SLP: No Oral Cavity - Dentition: Adequate  natural dentition Vision: Functional for self-feeding Self-Feeding Abilities: Needs assist;Needs set up Patient Positioning: Upright in bed Baseline Vocal Quality: Normal Volitional Cough: Strong Volitional Swallow: Able to elicit    Oral/Motor/Sensory Function Overall Oral Motor/Sensory Function: Mild impairment Facial ROM: Within Functional Limits Facial Symmetry: Within Functional Limits Facial Strength: Within Functional Limits Facial Sensation: Within Functional Limits Lingual ROM: Other (Comment) (Appeared mostly WFL, however pt unable to follow all commands completely, possibly some discoordination) Lingual Symmetry: Other (Comment) (grossly WFL) Lingual Strength: Other (Comment) (Grossly WFL) Lingual Sensation: Other (Comment) (Grossly WFL) Velum: Within Functional Limits Mandible: Within Functional Limits   Ice Chips Ice chips: Impaired Presentation: Spoon Oral Phase Functional Implications: Oral holding Other Comments: Initally tsps of ice chips, pt demonstrated oropharygneal swallow WFL, however last bolus pt held in mouth and then expectorated.   Thin Liquid Thin Liquid: Impaired Presentation: Cup Pharyngeal  Phase Impairments: Suspected delayed Swallow;Cough - Immediate Other Comments: Pt demonstrated overt s/s of aspiration with one sip of thin liquids by cup.    Nectar Thick Nectar Thick Liquid:  Not tested   Honey Thick Honey Thick Liquid: Impaired Presentation: Spoon;Straw Oral Phase Functional Implications: Oral holding Other Comments: Pt did not trigger swallow with any sips of honey by cup or straw. Pt held all boluses in mouth and then expectorated, despite max cues (verbal and tactile) to swallow.    Puree Puree: Impaired Presentation: Spoon Oral Phase Functional Implications: Oral holding Other Comments: Pt demonstrated good oropharyngeal swallow for 1 tsp of puree, but then held all remaining boluses. Eventually expectorating boluses.    Solid Solid:  Impaired Presentation: Self Fed Oral Phase Impairments: Impaired mastication Oral Phase Functional Implications: Oral holding Other Comments: Pt demonstrated decreased mastication of solid. Gave pt f/u tsp of puree to help clear solid bolus, however pt held both boluses in mouth prior to expectorating despite max cues.        Nobles,Toryn Mcclinton 02/21/2015,11:35 AM

## 2015-02-21 NOTE — Clinical Social Work Note (Signed)
Clinical Social Work Assessment  Patient Details  Name: Tyler Hale MRN: [id number] Date of Birth: [birth year]  Date of referral:  02/21/15               Reason for consult:  Facility Placement                Permission sought to share information with:   wife  Permission granted to share information::  Yes, Verbal Permission Granted  Name::      (wife)   Housing/Transportation Living arrangements for the past 2 months:  Single Family Home Source of Information:  Spouse Patient Interpreter Needed:  None Criminal Activity/Legal Involvement Pertinent to Current Situation/Hospitalization:  No - Comment as needed Significant Relationships:  Adult Children, Spouse Lives with:  Spouse Do you feel safe going back to the place where you live?  Yes Need for family participation in patient care:   yes  Care giving concerns:  No care giving concerns identified.    Social Worker assessment / plan:  CSW met with pt's and daughter in law to address consult for SNF. CSW introduce herself and explained role of social work. CSW also process of discharging to SNF. Pt's family preference is Materials engineer. [facility] has a bed for him when discharged.   Employment status:  Retired Forensic scientist:  Medicare PT Recommendations:  [place] / Referral to community resources:  [place]  Patient/Family's Response to care:  Pt's family were pleasant and appreciative of CSW support.   Patient/Family's Understanding of and Emotional Response to Diagnosis, Current Treatment, and Prognosis:  Pt's family understands need for SNF.   Emotional Assessment Appearance:   well groomed Attitude/Demeanor/Rapport:  Unable to Assess (sleeping soundly) Affect (typically observed):  Unable to Assess (Sleeping soundly) Orientation:  Fluctuating Orientation (Suspected and/or reported Sundowners) Alcohol / Substance use:  Never Used Psych involvement (Current and  /or in the community):  No (Comment)  Discharge Needs  Concerns to be addressed:  No discharge needs identified Readmission within the last 30 days:  No Current discharge risk:  None Barriers to Discharge:  No Barriers Identified   Darden Dates, LCSW 02/21/2015, 4:15 PM

## 2015-02-21 NOTE — Care Management Important Message (Signed)
Important Message  Patient Details  Name: Tyler Hale MRN: [id number] Date of Birth: 17-May-1926   Medicare Important Message Given:  Yes    Shelbie Ammons, RN 02/21/2015, 9:56 AM

## 2015-02-21 NOTE — Plan of Care (Addendum)
Problem: Pain Managment: Goal: General experience of comfort will improve Outcome: Progressing Patient remains only alert and oriented to self, Wife at bedside most of day, remains on isolation for MRSA, Speech visited Patient earlier today and he was unable to follow commands and swallow remains NPO, unable to complete MRA would not follow commands and lay down, they may not be able to get to him until tomorrow morning due to full sch. Neuro checks have remained the same now q 4 hours, Patient became very upset and combative while being changed PRN haldol ordered and given this helped.

## 2015-02-21 NOTE — Progress Notes (Signed)
Initial Nutrition Assessment   INTERVENTION:   Coordination of Care: Await diet progression as medically able Medical Food Supplement Therapy: Will recommend once able to safely advance diet   NUTRITION DIAGNOSIS:   Inadequate oral intake related to inability to eat as evidenced by NPO status.  GOAL:   Patient will meet greater than or equal to 90% of their needs  MONITOR:    (Energy Intake, Digestive System, Electrolyte and Renal Profile, Anthropometrics)  REASON FOR ASSESSMENT:   Malnutrition Screening Tool    ASSESSMENT:   Pt admitted with massive stroke per MD note. Pt with h/o stroke on 01/12/2015 treated at [facility].   Past Medical History  Diagnosis Date  . GERD (gastroesophageal reflux disease)   . Brain injury ([place]) 06/07/2012  . Cancer [facility]) 2004    bladder  . Cancer [facility])     prostate  . Arthritis   . Stroke [facility])      Diet Order:  Diet NPO time specified    Current Nutrition: Pt NPO currently. Spoke with Janne Lab, SLP who recommends continuing NPO at this time not safe to take po's.   Food/Nutrition-Related History: Pt wife reports pt ate cube steak and rice yesterday for lunch about 50% and that pt's son brought him an ensure that he drank about 50% of. Pt wife reports pt was eating 3 meals a day but that they were small and also reports that pt was getting a lot of his medications in food and had started spitting it out at times thinking that it was medications.    Scheduled Medications:  . atorvastatin  40 mg Oral q1800  . Chlorhexidine Gluconate Cloth  6 each Topical Q0600  . mupirocin ointment  1 application Nasal BID    Continuous Medications:  . sodium chloride 100 mL/hr at 02/21/15 1107     Electrolyte/Renal Profile and Glucose Profile:   Recent Labs Lab 02/20/15 1804 02/21/15 0636  NA 140 142  K 4.6 4.8  CL 113* 116*  CO2 20* 21*  BUN 41* 36*  CREATININE 1.94* 1.75*  CALCIUM 8.6* 8.5*  GLUCOSE 107* 98   Protein  Profile:  Recent Labs Lab 02/20/15 1804  ALBUMIN 3.0*    Gastrointestinal Profile: Last BM: unknown   Nutrition-Focused Physical Exam Findings:  Unable to complete Nutrition-Focused physical exam at this time.     Weight Change: Pt family reports pt has lost 20lbs in one month, however per [facility] pt has lost 5lbs in 3 months   Skin:  Reviewed, no issues   Height:   Ht Readings from Last 1 Encounters:  02/20/15 5\' 10"  (1.778 m)    Weight:   Wt Readings from Last 1 Encounters:  02/20/15 170 lb 14.4 oz (77.52 kg)    Wt Readings from Last 10 Encounters:  02/20/15 170 lb 14.4 oz (77.52 kg)  11/01/14 175 lb 9.6 oz (79.652 kg)  10/22/14 178 lb (80.74 kg)  11/06/12 174 lb (78.926 kg)    BMI:  Body mass index is 24.52 kg/(m^2).  Estimated Nutritional Needs:   Kcal:  BEE: 1456kcals, TEE: (IF 1.1-1.3)(AF 1.2) 1922-2272kcals  Protein:  62-78g protein (0.8-1.0g/kg)  Fluid:  1938-23105mL of fluid (25-65mL/kg)   EDUCATION NEEDS:   Education needs no appropriate at this time   [place], RD, LDN Pager [phone]

## 2015-02-21 NOTE — Evaluation (Signed)
Physical Therapy Evaluation Patient Details Name: Tyler Hale MRN: [id number] DOB: 1926-10-16 Today's Date: 02/21/2015   History of Present Illness  Pt is here with R sided weakness, found to have acute on chronic CVA with R sided weakness  Clinical Impression  Pt is very confused, impulsive and is unable to follow basic instructions t/o PT exam.  He shows very little R UE strength/function, but is able to maintain standing w/o AD and shows some functional strength in R LE though generally his mental status limited a lot of PT intervention today.  Progress with continue to be difficult given his impulsiveness and lack of effective communication today.    Follow Up Recommendations SNF (per ability to participate)    Equipment Recommendations       Recommendations for Other Services       Precautions / Restrictions Precautions Precautions: Fall Restrictions Weight Bearing Restrictions: No      Mobility  Bed Mobility Overal bed mobility: Independent             General bed mobility comments: Pt very confused and impulsive, able to get sitting at EOB but is not aware or safe with this  Transfers Overall transfer level: Needs assistance Equipment used: Rolling walker (2 wheeled) Transfers: Sit to/from Stand Sit to Stand: Min guard         General transfer comment: Pt is able to get to standing w/o direct assist however he is very confused and impulsive and with his inability to communicate safety was a big concern.  Pt did hold walker with L hand, but R hand essentially flaccid  Ambulation/Gait Ambulation/Gait assistance: Mod assist;Max assist Ambulation Distance (Feet): 3 Feet         General Gait Details: pt briefly holding walker, then able to take 1 step w/o AD but pt is very impulsive, unsafe and did not appear appropriate to do much walking secondary to his inability to follow instruction  Stairs            Wheelchair Mobility    Modified Rankin  (Stroke Patients Only)       Balance                                             Pertinent Vitals/Pain Pain Assessment:  (uanble to rate, does not appear to be in pain)    Home Living Family/patient expects to be discharged to:: Skilled nursing facility                      Prior Function           Comments: pt unable to effectively reports PLOF, it appears unlikely that he was able to do a lot, though unable to verify any real level of function     Hand Dominance        Extremity/Trunk Assessment   Upper Extremity Assessment: RUE deficits/detail RUE Deficits / Details: R UE with little to no AROM of apparent strength, L UE appears grossly functional though formal testing is difficult secondary to pt's mental status         Lower Extremity Assessment: Generalized weakness;RLE deficits/detail RLE Deficits / Details: Pt does not at all follow formal testing, but does show relatively confident AROM with R LE with decent quality of motion       Communication   Communication: Receptive  difficulties;Expressive difficulties  Cognition   Behavior During Therapy: Impulsive;Restless Overall Cognitive Status: Difficult to assess                      General Comments      Exercises        Assessment/Plan    PT Assessment    PT Diagnosis Difficulty walking;Generalized weakness   PT Problem List    PT Treatment Interventions     PT Goals (Current goals can be found in the Care Plan section) Acute Rehab PT Goals Patient Stated Goal: unable to communicate PT Goal Formulation: With patient Time For Goal Achievement: 03/07/15 Potential to Achieve Goals: Poor    Frequency     Barriers to discharge        Co-evaluation               End of Session   Activity Tolerance:  (limited secondary to impulsiveness and mental status) Patient left: with bed alarm set           Time: [id number] PT Time Calculation (min)  (ACUTE ONLY): 15 min   Charges:   PT Evaluation $Initial PT Evaluation Tier I: 1 Procedure     PT G Codes:       Wayne Both, PT, DPT [phone]  Kreg Shropshire 02/21/2015, 11:06 AM

## 2015-02-21 NOTE — Progress Notes (Signed)
OT Cancellation Note  Patient Details Name: Tyler Hale MRN: [id number] DOB: 12-22-1926   Cancelled Treatment:    Reason Eval/Treat Not Completed: Patient at procedure or test/ unavailable.  Sharon Mt 02/21/2015, 9:24 AM

## 2015-02-21 NOTE — Progress Notes (Signed)
   02/21/15 1020  Clinical Encounter Type  Visited With Patient and family together  Visit Type Initial  Referral From Nurse  Consult/Referral To Chaplain  Spiritual Encounters  Spiritual Needs Literature;Prayer  Stress Factors  Patient Stress Factors Exhausted;Health changes  Family Stress Factors Exhausted;Family relationships;Health changes  Advance Directives (For Healthcare)  Does patient have an advance directive? No  Would patient like information on creating an advanced directive? Yes - Scientist, clinical (histocompatibility and immunogenetics) given  Met w/patient & family to provide AD education. Family confirmed pt. lacks cognitive ability to understand details of materials. Provided family materials and explained process. Advised that AD could not be completed due to patient's inability to understand. Moved into pastoral care & prayer with pt. and family.  Chap. Jovonne Wilton G. [place]

## 2015-02-21 NOTE — NC FL2 (Signed)
[place] LEVEL OF CARE SCREENING TOOL     IDENTIFICATION  Patient Name: Tyler Hale Birthdate: [birth year] Sex: male Admission Date (Current Location): 02/20/2015  [place] and [place] Number:     Facility and Address:  [facility], [address], [place], [place] [address]      Provider Number: [id number]  Attending Physician Name and Address:  Theodoro Grist, MD  Relative Name and Phone Number:       Current Level of Care: Hospital Recommended Level of Care: [place] Prior Approval Number:    Date Approved/Denied:   PASRR Number:  ( [id number] )  Discharge Plan: SNF    Current Diagnoses: Patient Active Problem List   Diagnosis Date Noted  . Stroke (cerebrum) ([place]) 02/20/2015  . Altered mental status 10/31/2014  . Confusion 10/30/2014  . Esophageal reflux 11/06/2012    Orientation ACTIVITIES/SOCIAL BLADDER RESPIRATION    Self  Passive Continent Normal  BEHAVIORAL SYMPTOMS/MOOD NEUROLOGICAL BOWEL NUTRITION STATUS   (none )  (none ) Continent Diet (Diet: NPO)  PHYSICIAN VISITS COMMUNICATION OF NEEDS Height & Weight Skin  30 days Verbally 5\' 10"  (177.8 cm) 170 lbs. Normal          AMBULATORY STATUS RESPIRATION    Assist extensive Normal      Personal Care Assistance Level of Assistance  Bathing, Feeding, Dressing Bathing Assistance: Limited assistance Feeding assistance: Independent Dressing Assistance: Limited assistance      Functional Limitations Info  Sight, Hearing, Speech Sight Info: Adequate Hearing Info: Adequate Speech Info: Adequate       SPECIAL CARE FACTORS FREQUENCY  PT (By licensed PT), OT (By licensed OT)     PT Frequency:  (5) OT Frequency:  (5)           Additional Factors Info  Code Status, Allergies, Psychotropic, Isolation Precautions Code Status Info:  (Full Code. ) Allergies Info:  (Prednisone, Vioxx Rofecoxib) Psychotropic Info:  (Ativan )   Isolation  Precautions Info:  (MRSA nasal swab )     Current Medications (02/21/2015): Current Facility-Administered Medications  Medication Dose Route Frequency Provider Last Rate Last Dose  . 0.9 %  sodium chloride infusion   Intravenous Continuous Theodoro Grist, MD 100 mL/hr at 02/21/15 1107    . aspirin suppository 300 mg  300 mg Rectal Daily Theodoro Grist, MD      . atorvastatin (LIPITOR) tablet 40 mg  40 mg Oral q1800 Vaughan Basta, MD      . Chlorhexidine Gluconate Cloth 2 % PADS 6 each  6 each Topical Q0600 Vaughan Basta, MD   6 each at 02/21/15 0600  . haloperidol lactate (HALDOL) injection 1 mg  1 mg Intravenous Q6H PRN Theodoro Grist, MD   1 mg at 02/21/15 1614  . LORazepam (ATIVAN) injection 1 mg  1 mg Intravenous Once Theodoro Grist, MD      . mupirocin ointment (BACTROBAN) 2 % 1 application  1 application Nasal BID Vaughan Basta, MD   1 application at [id number]   Do not use this list as official medication orders. Please verify with discharge summary.  Discharge Medications:   Medication List    ASK your doctor about these medications        aspirin EC 81 MG tablet  Take 81 mg by mouth daily.     atorvastatin 40 MG tablet  Commonly known as:  LIPITOR  Take 40 mg by mouth daily.     cholecalciferol 1000 UNITS tablet  Commonly known as:  VITAMIN D  Take 1,000 Units by mouth daily.     metroNIDAZOLE 500 MG tablet  Commonly known as:  FLAGYL  Take 500 mg by mouth 2 (two) times daily. Daily at 0900 and 2000.     risperiDONE 1 MG tablet  Commonly known as:  RISPERDAL  Take 1 mg by mouth every 8 (eight) hours as needed.     risperiDONE 1 MG tablet  Commonly known as:  RISPERDAL  Take 1 mg by mouth at bedtime.     traMADol 50 MG tablet  Commonly known as:  ULTRAM  Take by mouth every 6 (six) hours as needed.     traZODone 50 MG tablet  Commonly known as:  DESYREL  Take 50 mg by mouth at bedtime.     vitamin B-12 1000 MCG tablet  Commonly  known as:  CYANOCOBALAMIN  Take 1,000 mcg by mouth daily.        Relevant Imaging Results:  Relevant Lab Results:  Recent Labs    Additional Information  (SSN: [id number])  Loralyn Freshwater, LCSW

## 2015-02-21 NOTE — Consult Note (Signed)
CC: slurry speech   HPI: Tyler Hale is an 81 y.o. male  male with history of recent stroke in OCt of 2016 which left him with right upper extremity weakness and receptive as well as his expressive aphasia presents to the hospital with worsening right-sided weakness now in the right lower extremity. CT scan of the head revealed left parietal MCA distribution large stroke without a midline shift.  Pt's strength in his RLE improved, still weak RUE.   Past Medical History  Diagnosis Date  . GERD (gastroesophageal reflux disease)   . Brain injury ([place]) 06/07/2012  . Cancer [facility]) 2004    bladder  . Cancer [facility])     prostate  . Arthritis   . Stroke [facility])     Past Surgical History  Procedure Laterality Date  . Leg surgery Bilateral 2014  . Joint replacement Left     Total knee Replacement  . Hip pinning Left 1984    hit by tractor  . Fracture surgery Left     hip, jaw  . Green light laser turp (transurethral resection of prostate N/A 10/29/2014    Procedure: GREEN LIGHT LASER TURP (TRANSURETHRAL RESECTION OF PROSTATE;  Surgeon: Royston Cowper, MD;  Location: [facility] ORS;  Service: Urology;  Laterality: N/A;    Family History  Problem Relation Age of Onset  . Prostate cancer Brother   . Cancer - Other Brother     Liver    Social History:  reports that he has never smoked. He has never used smokeless tobacco. He reports that he does not drink alcohol or use illicit drugs.  Allergies  Allergen Reactions  . Prednisone Other (See Comments)    "blisters"  . Vioxx [Rofecoxib] Other (See Comments)    "bleeding of stomach"    Medications: I have reviewed the patient's current medications.  ROS: Unable to obtain due to aphasia   Physical Examination: Blood pressure 164/77, pulse 73, temperature 97.7 F (36.5 C), temperature source Oral, resp. rate 18, height 5\' 10"  (1.778 m), weight 170 lb 14.4 oz (77.52 kg), SpO2 99 %.    Neurological Examination Mental Status: Alert,  aphasic . Cranial Nerves: II: Discs flat bilaterally; Visual fields grossly normal, pupils equal, round, reactive to light and accommodation III,IV, VI: ptosis not present, extra-ocular motions intact bilaterally V,VII: R facial droop  VIII: hearing normal bilaterally IX,X: gag reflex present XI: bilateral shoulder shrug XII: midline tongue extension Motor: Right : Upper extremity   2/5    Left:     Upper extremity   5/5  Lower extremity   4/5     Lower extremity   5/5 Tone and bulk:normal tone throughout; no atrophy noted Sensory: Pinprick and light touch intact throughout, bilaterally Deep Tendon Reflexes: 1+ and symmetric throughout Plantars: Right: downgoing   Left: downgoing Cerebellar: normal finger-to-nose, normal rapid alternating movements and normal heel-to-shin test Gait: not tested      Laboratory Studies:   Basic Metabolic Panel:  Recent Labs Lab 02/20/15 1804 02/21/15 0636  NA 140 142  K 4.6 4.8  CL 113* 116*  CO2 20* 21*  GLUCOSE 107* 98  BUN 41* 36*  CREATININE 1.94* 1.75*  CALCIUM 8.6* 8.5*    Liver Function Tests:  Recent Labs Lab 02/20/15 1804  AST 78*  ALT 81*  ALKPHOS 127*  BILITOT 0.4  PROT 6.9  ALBUMIN 3.0*   No results for input(s): LIPASE, AMYLASE in the last 168 hours. No results for input(s): AMMONIA  in the last 168 hours.  CBC:  Recent Labs Lab 02/20/15 1804 02/21/15 0636  WBC 9.9 9.6  NEUTROABS 6.3  --   HGB 12.2* 11.3*  HCT 37.0* 34.1*  MCV 85.1 84.4  PLT 287 264    Cardiac Enzymes: No results for input(s): CKTOTAL, CKMB, CKMBINDEX, TROPONINI in the last 168 hours.  BNP: Invalid input(s): POCBNP  CBG:  Recent Labs Lab 02/20/15 [place] 107*    Microbiology: Results for orders placed or performed during the hospital encounter of 02/20/15  MRSA PCR Screening     Status: Abnormal   Collection Time: 02/21/15 12:23 AM  Result Value Ref Range Status   MRSA by PCR POSITIVE (A) NEGATIVE Final     Comment:        The GeneXpert MRSA Assay (FDA approved for NASAL specimens only), is one component of a comprehensive MRSA colonization surveillance program. It is not intended to diagnose MRSA infection nor to guide or monitor treatment for MRSA infections. READ BACK AND VERIFIED BY [place] @0211  02/21/15.Marland KitchenMarland KitchenAJO     Coagulation Studies:  Recent Labs  02/20/15 1804  LABPROT 13.9  INR 1.05    Urinalysis: No results for input(s): COLORURINE, LABSPEC, PHURINE, GLUCOSEU, HGBUR, BILIRUBINUR, KETONESUR, PROTEINUR, UROBILINOGEN, NITRITE, LEUKOCYTESUR in the last 168 hours.  Invalid input(s): APPERANCEUR  Lipid Panel:     Component Value Date/Time   CHOL 167 02/20/2015 2103   TRIG 148 02/20/2015 2103   HDL 33* 02/20/2015 2103   CHOLHDL 5.1 02/20/2015 2103   VLDL 30 02/20/2015 2103   LDLCALC 104* 02/20/2015 2103    HgbA1C:  Lab Results  Component Value Date   HGBA1C 6.0 10/29/2014    Urine Drug Screen:  No results found for: LABOPIA, COCAINSCRNUR, LABBENZ, AMPHETMU, THCU, LABBARB  Alcohol Level: No results for input(s): ETH in the last 168 hours.  Imaging: Ct Head Wo Contrast  02/20/2015  CLINICAL DATA:  Left-sided weakness and fall yesterday. Altered mental status. Stroke. EXAM: CT HEAD WITHOUT CONTRAST TECHNIQUE: Contiguous axial images were obtained from the base of the skull through the vertex without intravenous contrast. COMPARISON:  None. FINDINGS: Cytotoxic edema is seen within the left parietal lobe throughout the MCA distribution, consistent with acute left middle cerebral artery distribution infarct. No evidence of hemorrhage, mass effect, or midline shift. No abnormal extra-axial fluid collections identified. Mild diffuse cerebral atrophy noted as well as old left fall basal ganglia lacunar infarct. Mild chronic small vessel disease also noted. No evidence of skull fracture. IMPRESSION: Acute large left parietal MCA distribution cerebral infarct. No evidence  of associated hemorrhage, mass effect, or midline shift. Mild cerebral atrophy, chronic small vessel disease, and old left basal ganglia lacunar infarct. These results were called by telephone at the time of interpretation on 02/20/2015 at 6:31 pm to Dr. Kerman Passey, who verbally acknowledged these results. Electronically Signed   By: Earle Gell M.D.   On: 02/20/2015 18:34     Assessment/Plan: 81 y.o. male  male with history of recent stroke in OCt of 2016 which left him with right upper extremity weakness and receptive as well as his expressive aphasia presents to the hospital with worsening right-sided weakness now in the right lower extremity. CT scan of the head revealed left parietal MCA distribution large stroke without a midline shift.  Pt's strength in his RLE improved, still weak RUE.   Pt was on on ASA therapy as per wife at home Do suspect small subcortical stroke Pt is going for  MRI/MRA with sedation Con't asa and statin Speech eval Pt/ot 02/21/2015, 2:38 PM

## 2015-02-22 ENCOUNTER — Inpatient Hospital Stay: Payer: Medicare Other

## 2015-02-22 LAB — BASIC METABOLIC PANEL
Anion gap: 7 (ref 5–15)
BUN: 32 mg/dL — AB (ref 6–20)
CALCIUM: 8.9 mg/dL (ref 8.9–10.3)
CO2: 19 mmol/L — ABNORMAL LOW (ref 22–32)
Chloride: 116 mmol/L — ABNORMAL HIGH (ref 101–111)
Creatinine, Ser: 1.64 mg/dL — ABNORMAL HIGH (ref 0.61–1.24)
GFR calc Af Amer: 42 mL/min — ABNORMAL LOW (ref >60)
GFR, EST NON AFRICAN AMERICAN: 36 mL/min — AB (ref >60)
GLUCOSE: 84 mg/dL (ref 65–99)
Potassium: 4.8 mmol/L (ref 3.5–5.1)
SODIUM: 142 mmol/L (ref 135–145)

## 2015-02-22 MED ORDER — HALOPERIDOL LACTATE 5 MG/ML IJ SOLN
1.0000 mg | Freq: Four times a day (QID) | INTRAMUSCULAR | Status: DC | PRN
  Administered 2015-02-22 (×3): 1 mg via INTRAMUSCULAR
  Filled 2015-02-22 (×2): qty 1

## 2015-02-22 MED ORDER — HALOPERIDOL LACTATE 5 MG/ML IJ SOLN
INTRAMUSCULAR | Status: AC
  Administered 2015-02-22: 01:00:00 1 mg via INTRAMUSCULAR
  Filled 2015-02-22: qty 1

## 2015-02-22 MED ORDER — DEXTROSE-NACL 5-0.45 % IV SOLN
INTRAVENOUS | Status: DC
  Administered 2015-02-22 – 2015-02-27 (×9): via INTRAVENOUS

## 2015-02-22 NOTE — Evaluation (Signed)
Occupational Therapy Evaluation Patient Details Name: Tyler Hale MRN: [id number] DOB: 1926-09-07 Today's Date: 02/22/2015    History of Present Illness Pt is here with R sided weakness, found to have acute on chronic CVA with R sided weakness with expressive and receptive aphasia   Clinical Impression   Pt present with expressive and receptive aphasia limiting his ability for performing  ADL's , R UE weakness - pt R hand dominant - because of aphasia strength in UE was more assess functionally. Vision and perception to assess because of pt was very restless and trying to get out of bed and attempting to get up to walk to door. Pt did get little more calm when wife enter room and stood next to bed talking to him.     Follow Up Recommendations  SNF    Equipment Recommendations       Recommendations for Other Services       Precautions / Restrictions Precautions Precautions: Fall Restrictions Weight Bearing Restrictions: No      Mobility Bed Mobi      Transfers Overall transfer level: Needs assistance Equipment used: 1 person hand held assist;2 person hand held assist Transfers: Sit to/from Stand Sit to Stand: Min assist;Mod assist           Balance                                            ADL                                               Vision     Perception     Praxis      Pertinent Vitals/Pain Pain Assessment: No/denies pain     Hand Dominance Right   Extremity/Trunk Assessment Upper Extremity Assessment RUE Deficits / Details: R UE difficutly to assess because of aphasia - was able to hold on the OT's hand and push up , support some during standing  - no able to follow direction; L UE appear functionally WNL            Communication Communication Communication: Receptive difficulties;Expressive difficulties   Cognition Arousal/Alertness: Awake/alert Behavior During Therapy:  Impulsive;Restless Overall Cognitive Status: Difficult to assess (Receptive and expressive aphasia)                     General Comments       Exercises  MOBILITY in ADL'S: Sit to stand min A leaning to the R, standing while being change min A with support thru R elbow ; side step wife demo and was min x 2 either side side stepping side of bed ; pt was restless and was max A to go sit to supine ; supine to sit Ind ADL;s -  Max A for dressing and bathing j- pt restless and aphasie impairing as well as R UE weakness  ADL's Toiletting Depend wearing and was incontinent   Shoulder Instructions      Home Living Family/patient expects to be discharged to:: Skilled nursing facility  Prior Functioning/Environment Level of Independence: Independent        Comments: after the Oct stroke pt was able to do ADL's , was ambulating Ind - and cognition was in tact     OT Diagnosis: Hemiplegia dominant side   OT Problem List: Decreased strength;Decreased range of motion;Decreased activity tolerance;Impaired balance (sitting and/or standing);Decreased safety awareness;Decreased cognition;Impaired UE functional use   OT Treatment/Interventions: Self-care/ADL training;Therapeutic exercise;Balance training;Neuromuscular education;Cognitive remediation/compensation;Visual/perceptual remediation/compensation;Patient/family education    OT Goals(Current goals can be found in the care plan section) Acute Rehab OT Goals Patient Stated Goal: pt unable to state OT Goal Formulation: With patient/family Time For Goal Achievement: 03/07/15  OT Frequency: Min 2X/week   Barriers to D/C:            Co-evaluation              End of Session Equipment Utilized During Treatment: Gait belt Nurse Communication: Other (comment) (nsg in and out with check on pt being restless)  Activity Tolerance:  (Restless and aphasie limiting pt  ) Patient left: in bed;with family/visitor present   Time: [id number] OT Time Calculation (min): 40 min Charges:  OT General Charges $OT Visit: 1 Procedure OT Evaluation $Initial OT Evaluation Tier I: 1 Procedure G-Codes:    Rosalyn Gess OTR/L,CLT 02/22/2015, 11:06 AM

## 2015-02-22 NOTE — Plan of Care (Signed)
Problem: Health Behavior/Discharge Planning: Goal: Ability to manage health-related needs will improve Outcome: Not Progressing Agitated-ativan and haldol given IM with improvement  Problem: Self-Care: Goal: Ability to participate in self-care as condition permits will improve Outcome: Not Progressing Flaccid on the right  Problem: Nutrition: Goal: Risk of aspiration will decrease Outcome: Not Progressing NPO until speech sees patient  Problem: Tissue Perfusion: Goal: Cerebral tissue perfusion will improve Outcome: Not Progressing No improvement in neurological status Goal: Complications of Ischemic Stroke will be minimized. Outcome: Progressing No worsening symptoms

## 2015-02-22 NOTE — Progress Notes (Signed)
Speech Language Pathology Dysphagia Treatment Patient Details Name: Tyler Hale MRN: [id number] DOB: 1926-07-21 Today's Date: 02/22/2015 Time: [id number] SLP Time Calculation (min) (ACUTE ONLY): 23 min  Assessment / Plan / Recommendation Clinical Impression   pt continues to present with a moderate to severe oral pharyngeal dysphagia characterized by coughing with nectar thick liquids and thin liquids. Pt was functional with ndds2 solids and was easily cued to swallow. Pt was noted to have prolonged mastication and holding of bolus trials, however presented with empty spoon was able to swallow. ST educated family on swallow strategies and diet recommendations.     Diet Recommendation    NDDS2 with honey thick liquids   SLP Plan   cont with current poc  Pertinent Vitals/Pain No pain reported   Swallowing Goals     General Behavior/Cognition: Cooperative;Confused;Impulsive Patient Positioning: Upright in bed Oral care provided: N/A HPI: Tyler Hale is a 81 y.o. male with a known history of gastric reflux, bladder cancer, prostate cancer, arthritis, recent stroke in October 2016 was admitted at [facility]- as the stroke happened near there and was taken over there he stayed in the hospital for a few days, then was discharged to decrease versus for further rehabilitation. As per wife daughter and son who are present in the room, getting the last stroke he suffered from speech problem and right upper extremity weakness. He had a gradual recovery in his speech up to the level he was able to speak 3-4 word sentences and was able to come any get an answer to questions. But he was not able to move his right upper extremity. Yesterday at nursing home he stayed sleepy most of the time and did not get up for physical therapy also wife also noticed his face is slightly deviated and looks weak on his right side, she drew attention of the nurses towards this, they spoke to the doctor on-call and  as per them he started on some kind of blood thinner. Today morning when they tried to get him up for physical therapy he was not able to maintain his balance, and his right leg was very very weak. He also had lost his speech today again. As per family he never had any weakness on his lower extremities and was able to walk fine up until day before yesterday.  Oral Cavity - Oral Hygiene     Dysphagia Treatment Family/Caregiver Educated: grand daughter Treatment Methods: Skilled observation;Upgraded PO texture trial;Patient/caregiver education Patient observed directly with PO's: Yes Type of PO's observed: Dysphagia 2 (chopped);Dysphagia 1 (puree);Thin liquids;Nectar-thick liquids;Honey-thick liquids;Ice chips Feeding: Needs assist Liquids provided via: Teaspoon;Cup;No straw Oral Phase Signs & Symptoms: Anterior loss/spillage;Prolonged mastication;Prolonged bolus formation;Oral holding Pharyngeal Phase Signs & Symptoms: Suspected delayed swallow initiation;Audible swallow;Immediate throat clear;Immediate cough;Watery eyes Type of cueing: Verbal;Tactile Amount of cueing: Moderate   GO     Tyler Hale 02/22/2015, 11:57 AM

## 2015-02-22 NOTE — Progress Notes (Signed)
Dr. Irish Elders would like pt to be put back on cardiac monitor.  Order put in and tele monitor placed on pt.  Clarise Cruz, RN

## 2015-02-22 NOTE — Plan of Care (Signed)
Problem: Education: Goal: Knowledge of [place] [facility] Education information/materials will improve Outcome: Not Progressing Pt unable to communicate education retrieved  Problem: Safety: Goal: Ability to remain free from injury will improve Outcome: Not Progressing Pt pulled out IV  Problem: Pain Managment: Goal: General experience of comfort will improve Outcome: Not Progressing Pt restless

## 2015-02-22 NOTE — Consult Note (Addendum)
CC: slurry speech   HPI: Tyler Hale is an 81 y.o. male  male with history of recent stroke in OCt of 2016 which left him with right upper extremity weakness and receptive as well as his expressive aphasia presents to the hospital with worsening right-sided weakness now in the right lower extremity. CT scan of the head revealed left parietal MCA distribution large stroke without a midline shift.  Pt's strength in his RLE improved, still weak RUE.   More somulent today.   Past Medical History  Diagnosis Date  . GERD (gastroesophageal reflux disease)   . Brain injury ([place]) 06/07/2012  . Cancer [facility]) 2004    bladder  . Cancer [facility])     prostate  . Arthritis   . Stroke [facility])     Past Surgical History  Procedure Laterality Date  . Leg surgery Bilateral 2014  . Joint replacement Left     Total knee Replacement  . Hip pinning Left 1984    hit by tractor  . Fracture surgery Left     hip, jaw  . Green light laser turp (transurethral resection of prostate N/A 10/29/2014    Procedure: GREEN LIGHT LASER TURP (TRANSURETHRAL RESECTION OF PROSTATE;  Surgeon: Royston Cowper, MD;  Location: [facility] ORS;  Service: Urology;  Laterality: N/A;    Family History  Problem Relation Age of Onset  . Prostate cancer Brother   . Cancer - Other Brother     Liver    Social History:  reports that he has never smoked. He has never used smokeless tobacco. He reports that he does not drink alcohol or use illicit drugs.  Allergies  Allergen Reactions  . Prednisone Other (See Comments)    "blisters"  . Vioxx [Rofecoxib] Other (See Comments)    "bleeding of stomach"    Medications: I have reviewed the patient's current medications.  ROS: Unable to obtain due to aphasia   Physical Examination: Blood pressure 141/52, pulse 50, temperature 97 F (36.1 C), temperature source Axillary, resp. rate 18, height 5\' 10"  (1.778 m), weight 170 lb 14.4 oz (77.52 kg), SpO2 99 %.    Neurological  Examination Mental Status: Alert, aphasic . Cranial Nerves: II: Discs flat bilaterally; Visual fields grossly normal, pupils equal, round, reactive to light and accommodation III,IV, VI: ptosis not present, extra-ocular motions intact bilaterally V,VII: R facial droop  VIII: hearing normal bilaterally IX,X: gag reflex present XI: bilateral shoulder shrug XII: midline tongue extension Motor: Right : Upper extremity   2/5    Left:     Upper extremity   5/5  Lower extremity   4/5     Lower extremity   5/5 Tone and bulk:normal tone throughout; no atrophy noted Sensory: Pinprick and light touch intact throughout, bilaterally Deep Tendon Reflexes: 1+ and symmetric throughout Plantars: Right: downgoing   Left: downgoing Cerebellar: normal finger-to-nose, normal rapid alternating movements and normal heel-to-shin test Gait: not tested      Laboratory Studies:   Basic Metabolic Panel:  Recent Labs Lab 02/20/15 1804 02/21/15 0636 02/22/15 0513  NA 140 142 142  K 4.6 4.8 4.8  CL 113* 116* 116*  CO2 20* 21* 19*  GLUCOSE 107* 98 84  BUN 41* 36* 32*  CREATININE 1.94* 1.75* 1.64*  CALCIUM 8.6* 8.5* 8.9    Liver Function Tests:  Recent Labs Lab 02/20/15 1804  AST 78*  ALT 81*  ALKPHOS 127*  BILITOT 0.4  PROT 6.9  ALBUMIN 3.0*   No  results for input(s): LIPASE, AMYLASE in the last 168 hours. No results for input(s): AMMONIA in the last 168 hours.  CBC:  Recent Labs Lab 02/20/15 1804 02/21/15 0636  WBC 9.9 9.6  NEUTROABS 6.3  --   HGB 12.2* 11.3*  HCT 37.0* 34.1*  MCV 85.1 84.4  PLT 287 264    Cardiac Enzymes: No results for input(s): CKTOTAL, CKMB, CKMBINDEX, TROPONINI in the last 168 hours.  BNP: Invalid input(s): POCBNP  CBG:  Recent Labs Lab 02/20/15 [place] 107*    Microbiology: Results for orders placed or performed during the hospital encounter of 02/20/15  MRSA PCR Screening     Status: Abnormal   Collection Time: 02/21/15 12:23 AM   Result Value Ref Range Status   MRSA by PCR POSITIVE (A) NEGATIVE Final    Comment:        The GeneXpert MRSA Assay (FDA approved for NASAL specimens only), is one component of a comprehensive MRSA colonization surveillance program. It is not intended to diagnose MRSA infection nor to guide or monitor treatment for MRSA infections. READ BACK AND VERIFIED BY [place] @0211  02/21/15.Marland KitchenMarland KitchenAJO     Coagulation Studies:  Recent Labs  02/20/15 1804  LABPROT 13.9  INR 1.05    Urinalysis: No results for input(s): COLORURINE, LABSPEC, PHURINE, GLUCOSEU, HGBUR, BILIRUBINUR, KETONESUR, PROTEINUR, UROBILINOGEN, NITRITE, LEUKOCYTESUR in the last 168 hours.  Invalid input(s): APPERANCEUR  Lipid Panel:     Component Value Date/Time   CHOL 167 02/20/2015 2103   TRIG 148 02/20/2015 2103   HDL 33* 02/20/2015 2103   CHOLHDL 5.1 02/20/2015 2103   VLDL 30 02/20/2015 2103   LDLCALC 104* 02/20/2015 2103    HgbA1C:  Lab Results  Component Value Date   HGBA1C 6.0 10/29/2014    Urine Drug Screen:  No results found for: LABOPIA, COCAINSCRNUR, LABBENZ, AMPHETMU, THCU, LABBARB  Alcohol Level: No results for input(s): ETH in the last 168 hours.  Imaging: Ct Head Wo Contrast  02/20/2015  CLINICAL DATA:  Left-sided weakness and fall yesterday. Altered mental status. Stroke. EXAM: CT HEAD WITHOUT CONTRAST TECHNIQUE: Contiguous axial images were obtained from the base of the skull through the vertex without intravenous contrast. COMPARISON:  None. FINDINGS: Cytotoxic edema is seen within the left parietal lobe throughout the MCA distribution, consistent with acute left middle cerebral artery distribution infarct. No evidence of hemorrhage, mass effect, or midline shift. No abnormal extra-axial fluid collections identified. Mild diffuse cerebral atrophy noted as well as old left fall basal ganglia lacunar infarct. Mild chronic small vessel disease also noted. No evidence of skull fracture.  IMPRESSION: Acute large left parietal MCA distribution cerebral infarct. No evidence of associated hemorrhage, mass effect, or midline shift. Mild cerebral atrophy, chronic small vessel disease, and old left basal ganglia lacunar infarct. These results were called by telephone at the time of interpretation on 02/20/2015 at 6:31 pm to Dr. Kerman Passey, who verbally acknowledged these results. Electronically Signed   By: Earle Gell M.D.   On: 02/20/2015 18:34     Assessment/Plan: 81 y.o. male  male with history of recent stroke in OCt of 2016 which left him with right upper extremity weakness and receptive as well as his expressive aphasia presents to the hospital with worsening right-sided weakness now in the right lower extremity. CT scan of the head revealed left parietal MCA distribution large stroke without a midline shift.  Pt's strength in his RLE improved, still weak RUE.   More somulent today. Difficult to  arouse at times.  Likely component of delirium Pt is sleeping during the day and restless at night.   Will start amantadine 100 BID for 7 days when able to swallow on NGT in place Please restart hydration as not eating  Still awaiting MRI/MRA  Pt will be placed on tele Rectal ASA  Leotis Pain  02/22/2015, 12:15 PM

## 2015-02-22 NOTE — Progress Notes (Signed)
Physical Therapy Treatment Patient Details Name: NICKALUS KRAS MRN: [id number] DOB: 10/01/1926 Today's Date: 02/22/2015    History of Present Illness Pt is here with R sided weakness, found to have acute on chronic CVA with R sided weakness with expressive and receptive aphasia    PT Comments    Pt was able to show some effort with activity today, but did not appear to be as strong or steady today with exercises and standing.  Yesterday he was able to take a few confused steps but ultimately was not safe.  Today he was unable to try taking any steps with and w/o HHA.  Follow Up Recommendations  SNF     Equipment Recommendations       Recommendations for Other Services       Precautions / Restrictions Precautions Precautions: Fall Restrictions Weight Bearing Restrictions: No    Mobility  Bed Mobility Overal bed mobility: Needs Assistance Bed Mobility: Supine to Sit;Sit to Supine     Supine to sit: Mod assist Sit to supine: Mod assist   General bed mobility comments: Pt again confused, appears to try to get up but needs assist today and was not able to sit up on his own  Transfers Overall transfer level: Needs assistance Equipment used: 1 person hand held assist;2 person hand held assist Transfers: Sit to/from Stand Sit to Stand: Min assist;Mod assist         General transfer comment: 3 different standing bouts and pt with varying ability to get to standing needing some physical assist each time  Ambulation/Gait             General Gait Details: Pt very much struggles with standing balance today and is unable to coordinate meaningful stepping (either forward or side) despite much assist and encouragement from PT and granddaughter.   Stairs            Wheelchair Mobility    Modified Rankin (Stroke Patients Only)       Balance                                    Cognition Arousal/Alertness: Awake/alert Behavior During Therapy:  Impulsive;Restless Overall Cognitive Status: Difficult to assess (Receptive and expressive aphasia)                      Exercises General Exercises - Lower Extremity Long Arc Quad: 10 reps;AAROM;AROM;Both Hip ABduction/ADduction: AROM;AAROM;10 reps;Both Hip Flexion/Marching: PROM;AAROM;10 reps;Both    General Comments        Pertinent Vitals/Pain Pain Assessment: No/denies pain    Home Living Family/patient expects to be discharged to:: Skilled nursing facility                    Prior Function Level of Independence: Independent      Comments: after the Oct stroke pt was able to do ADL's , was ambulating Ind - and cognition was in tact    PT Goals (current goals can now be found in the care plan section) Progress towards PT goals: Progressing toward goals    Frequency  7X/week    PT Plan Current plan remains appropriate    Co-evaluation             End of Session Equipment Utilized During Treatment: Gait belt Activity Tolerance:  (Pt appears to try showing effort, but is too confused ) Patient left: with  bed alarm set;with family/visitor present     Time: 0901-0927 PT Time Calculation (min) (ACUTE ONLY): 26 min  Charges:  $Therapeutic Exercise: 8-22 mins $Therapeutic Activity: 8-22 mins                    G Codes:     Wayne Both, PT, DPT [phone]  Kreg Shropshire 02/22/2015, 10:56 AM

## 2015-02-22 NOTE — Progress Notes (Addendum)
[facility] at [place] NAME: Tyler Hale    MR#:  [id number]  DATE OF BIRTH:  [birth year]  SUBJECTIVE:  CHIEF COMPLAINT:   Chief Complaint  Patient presents with  . Cerebrovascular Accident   patient is a 81 year old Caucasian male with history of recent stroke which left him with right upper extremity weakness and receptive as well as his expressive aphasia presents to the hospital with worsening right-sided weakness now in the right lower extremity. CT scan of the head revealed left parietal MCA distribution large stroke which was described as acute. MRI/MRA of brain is pending. Patient is able to move her right lower extremity , right upper extremity is flaccid, no review of systems due to aphasia. Remains stable since yesterday, agitated intermittently, given Ativan did not help Haldol seemed to improve agitation. Discussed this patient's family   Review of Systems  Unable to perform ROS: language    VITAL SIGNS: Blood pressure 141/52, pulse 50, temperature 97 F (36.1 C), temperature source Axillary, resp. rate 18, height 5\' 10"  (1.778 m), weight 77.52 kg (170 lb 14.4 oz), SpO2 99 %.  PHYSICAL EXAMINATION:   GENERAL:  81 y.o.-year-old patient lying in the bed with no acute distress.  EYES: Pupils equal, round, reactive to light and accommodation. No scleral icterus. Extraocular muscles intact.  HEENT: Head atraumatic, normocephalic. Oropharynx and nasopharynx clear.  NECK:  Supple, no jugular venous distention. No thyroid enlargement, no tenderness.  LUNGS: Normal breath sounds bilaterally, no wheezing, rales,rhonchi or crepitation. No use of accessory muscles of respiration.  CARDIOVASCULAR: S1, S2 normal. No murmurs, rubs, or gallops.  ABDOMEN: Soft, nontender, nondistended. Bowel sounds present. No organomegaly or mass.  EXTREMITIES: No pedal edema, cyanosis, or clubbing.  NEUROLOGIC: Cranial nerves II through XII are intact.  Muscle strength 0/5 in the right upper extremity, 4 out of 5 in right lower extremity, 5 out of 5 in left upper as well as lower extremities. Sensation not able to evaluate. Gait not checked.  PSYCHIATRIC: The patient is alert and oriented x 3.  SKIN: No obvious rash, lesion, or ulcer.   ORDERS/RESULTS REVIEWED:   CBC  Recent Labs Lab 02/20/15 1804 02/21/15 0636  WBC 9.9 9.6  HGB 12.2* 11.3*  HCT 37.0* 34.1*  PLT 287 264  MCV 85.1 84.4  MCH 28.1 27.9  MCHC 33.1 33.1  RDW 14.9* 14.4  LYMPHSABS 2.2  --   MONOABS 0.8  --   EOSABS 0.6  --   BASOSABS 0.1  --    ------------------------------------------------------------------------------------------------------------------  Chemistries   Recent Labs Lab 02/20/15 1804 02/21/15 0636 02/22/15 0513  NA 140 142 142  K 4.6 4.8 4.8  CL 113* 116* 116*  CO2 20* 21* 19*  GLUCOSE 107* 98 84  BUN 41* 36* 32*  CREATININE 1.94* 1.75* 1.64*  CALCIUM 8.6* 8.5* 8.9  AST 78*  --   --   ALT 81*  --   --   ALKPHOS 127*  --   --   BILITOT 0.4  --   --    ------------------------------------------------------------------------------------------------------------------ estimated creatinine clearance is 33.4 mL/min (by C-G formula based on Cr of 1.64). ------------------------------------------------------------------------------------------------------------------ No results for input(s): TSH, T4TOTAL, T3FREE, THYROIDAB in the last 72 hours.  Invalid input(s): FREET3  Cardiac Enzymes No results for input(s): CKMB, TROPONINI, MYOGLOBIN in the last 168 hours.  Invalid input(s): CK ------------------------------------------------------------------------------------------------------------------ Invalid input(s): POCBNP ---------------------------------------------------------------------------------------------------------------  RADIOLOGY: Ct Head Wo Contrast  02/20/2015  CLINICAL DATA:  Left-sided weakness and fall yesterday.  Altered mental status. Stroke. EXAM: CT HEAD WITHOUT CONTRAST TECHNIQUE: Contiguous axial images were obtained from the base of the skull through the vertex without intravenous contrast. COMPARISON:  None. FINDINGS: Cytotoxic edema is seen within the left parietal lobe throughout the MCA distribution, consistent with acute left middle cerebral artery distribution infarct. No evidence of hemorrhage, mass effect, or midline shift. No abnormal extra-axial fluid collections identified. Mild diffuse cerebral atrophy noted as well as old left fall basal ganglia lacunar infarct. Mild chronic small vessel disease also noted. No evidence of skull fracture. IMPRESSION: Acute large left parietal MCA distribution cerebral infarct. No evidence of associated hemorrhage, mass effect, or midline shift. Mild cerebral atrophy, chronic small vessel disease, and old left basal ganglia lacunar infarct. These results were called by telephone at the time of interpretation on 02/20/2015 at 6:31 pm to Dr. Kerman Passey, who verbally acknowledged these results. Electronically Signed   By: Earle Gell M.D.   On: 02/20/2015 18:34    EKG:  Orders placed or performed during the hospital encounter of 02/20/15  . ED EKG  . ED EKG    ASSESSMENT AND PLAN:  Principal Problem:   Stroke (cerebrum) ([place]) 1. Acute stroke in the left MCA distribution with right-sided weakness, and improving right lower extremity weakness. MRI/MRA of brain is pending,  neurology consultation  is appreciated  . Patient was seen, by speech therapist and recommended dysphagia diet . Change medications to oral route , adding amantadine per neurologist recommendations 2. Essential hypertension, stable. No treatment unless above [id number] systolic 3. Acute on chronic renal failure, stable. Continue IV fluid administration as patient's oral intake is poor 4. Acidosison BMP , . Compensated on  VBG 5. Anemia, follow with rehydration 6. Dysphagia, continue patient on IV  fluids. Speech therapist reevaluated patient today and recommended dysphagia 2 diet with honey thick liquids   Management plans discussed with the patient, family and they are in agreement.   DRUG ALLERGIES:  Allergies  Allergen Reactions  . Prednisone Other (See Comments)    "blisters"  . Vioxx [Rofecoxib] Other (See Comments)    "bleeding of stomach"    CODE STATUS:     Code Status Orders        Start     Ordered   02/20/15 2231  Full code   Continuous     02/20/15 2230      TOTAL TIME TAKING CARE OF THIS PATIENT:  65minutes.  Coordination of care time 20 minutes on discussion with both gradaughter and patient's wife on separate occasions about condition, treatment plan and possible transfer to [facility].  Theodoro Grist M.D on 02/22/2015 at 1:28 PM  Between 7am to 6pm - Pager - [phone]  After 6pm go to [email or web address] - password [email or web address] [facility]  Office  [phone]  CC: Primary care physician; Albina Billet, MD

## 2015-02-23 MED ORDER — AMANTADINE HCL 50 MG/5ML PO SYRP
100.0000 mg | ORAL_SOLUTION | Freq: Two times a day (BID) | ORAL | Status: DC
  Administered 2015-02-23: 13:00:00 100 mg via ORAL
  Filled 2015-02-23 (×2): qty 10

## 2015-02-23 MED ORDER — ASPIRIN 81 MG PO CHEW
81.0000 mg | CHEWABLE_TABLET | Freq: Every day | ORAL | Status: DC
  Administered 2015-02-23 – 2015-02-28 (×4): 81 mg via ORAL
  Filled 2015-02-23 (×7): qty 1

## 2015-02-23 MED ORDER — ATORVASTATIN CALCIUM 20 MG PO TABS
80.0000 mg | ORAL_TABLET | Freq: Every day | ORAL | Status: DC
Start: 2015-02-23 — End: 2015-02-28
  Administered 2015-02-23 – 2015-02-26 (×3): 80 mg via ORAL
  Filled 2015-02-23 (×4): qty 4

## 2015-02-23 MED ORDER — AMANTADINE HCL 100 MG PO CAPS
100.0000 mg | ORAL_CAPSULE | Freq: Two times a day (BID) | ORAL | Status: DC
  Administered 2015-02-23 – 2015-02-27 (×5): 100 mg via ORAL
  Filled 2015-02-23 (×14): qty 1

## 2015-02-23 NOTE — Progress Notes (Signed)
[place] at [place] NAME: Tyler Hale    MR#:  [id number]  DATE OF BIRTH:  Mar 07, 1927  SUBJECTIVE:  CHIEF COMPLAINT:   Chief Complaint  Patient presents with  . Cerebrovascular Accident   patient is a 81 year old Caucasian male with history of recent stroke which left him with right upper extremity weakness and receptive as well as his expressive aphasia presents to the hospital with worsening right-sided weakness now in the right lower extremity. CT scan of the head revealed left parietal MCA distribution large stroke which was described as acute. MRI/MRA of brain is not done due to intolerance. Patient is able to move his right lower extremity, but still has difficulty standing on it , right upper extremity is flaccid, no review of systems due to aphasia. Remains stable since yesterday, agitated intermittently, is being given Haldol . Better oral intake. Likely discharged to skilled nursing facility tomorrow. Medications are changed to oral. . Discussed this patient's family extensively today again , All questions answered   Review of Systems  Unable to perform ROS: language    VITAL SIGNS: Blood pressure 104/58, pulse 108, temperature 97.5 F (36.4 C), temperature source Oral, resp. rate 18, height 5\' 10"  (1.778 m), weight 77.52 kg (170 lb 14.4 oz), SpO2 97 %.  PHYSICAL EXAMINATION:   GENERAL:  81 y.o.-year-old patient lying in the bed with no acute distress.  EYES: Pupils equal, round, reactive to light and accommodation. No scleral icterus. Extraocular muscles intact.  HEENT: Head atraumatic, normocephalic. Oropharynx and nasopharynx clear.  NECK:  Supple, no jugular venous distention. No thyroid enlargement, no tenderness.  LUNGS: Normal breath sounds bilaterally, no wheezing, rales,rhonchi or crepitation. No use of accessory muscles of respiration.  CARDIOVASCULAR: S1, S2 normal. No murmurs, rubs, or gallops.  ABDOMEN: Soft,  nontender, nondistended. Bowel sounds present. No organomegaly or mass.  EXTREMITIES: No pedal edema, cyanosis, or clubbing.  NEUROLOGIC: Cranial nerves II through XII are intact. Muscle strength 0/5 in the right upper extremity, 4 out of 5 in right lower extremity, 5 out of 5 in left upper as well as lower extremities. Sensation not able to evaluate. Gait not checked.  PSYCHIATRIC: The patient is alert and oriented x 3.  SKIN: No obvious rash, lesion, or ulcer.   ORDERS/RESULTS REVIEWED:   CBC  Recent Labs Lab 02/20/15 1804 02/21/15 0636  WBC 9.9 9.6  HGB 12.2* 11.3*  HCT 37.0* 34.1*  PLT 287 264  MCV 85.1 84.4  MCH 28.1 27.9  MCHC 33.1 33.1  RDW 14.9* 14.4  LYMPHSABS 2.2  --   MONOABS 0.8  --   EOSABS 0.6  --   BASOSABS 0.1  --    ------------------------------------------------------------------------------------------------------------------  Chemistries   Recent Labs Lab 02/20/15 1804 02/21/15 0636 02/22/15 0513  NA 140 142 142  K 4.6 4.8 4.8  CL 113* 116* 116*  CO2 20* 21* 19*  GLUCOSE 107* 98 84  BUN 41* 36* 32*  CREATININE 1.94* 1.75* 1.64*  CALCIUM 8.6* 8.5* 8.9  AST 78*  --   --   ALT 81*  --   --   ALKPHOS 127*  --   --   BILITOT 0.4  --   --    ------------------------------------------------------------------------------------------------------------------ estimated creatinine clearance is 33.4 mL/min (by C-G formula based on Cr of 1.64). ------------------------------------------------------------------------------------------------------------------ No results for input(s): TSH, T4TOTAL, T3FREE, THYROIDAB in the last 72 hours.  Invalid input(s): FREET3  Cardiac Enzymes No results for input(s):  CKMB, TROPONINI, MYOGLOBIN in the last 168 hours.  Invalid input(s): CK ------------------------------------------------------------------------------------------------------------------ Invalid input(s):  POCBNP ---------------------------------------------------------------------------------------------------------------  RADIOLOGY: No results found.  EKG:  Orders placed or performed during the hospital encounter of 02/20/15  . ED EKG  . ED EKG    ASSESSMENT AND PLAN:  Principal Problem:   Stroke (cerebrum) ([place]) 1. Acute stroke in the left MCA distribution with right-sided weakness, and improving right lower extremity weakness. MRI/MRA of brain idone due to patient's intolerance,  neurology consultation  is appreciated  . Patient was seen by speech therapist and recommended dysphagia diet . Oral intake has improved added amantadine per neurologist recommendations 2. Essential hypertension, stable. No treatment unless above [id number] systolic 3. Acute on chronic renal failure, stable. Continue IV fluid administration as patient's oral intake is poor 4. Acidosis on BMP , . Compensated on  VBG 5. Anemia, follow with rehydration 6. Dysphagia, continue patient on IV fluidsAt lower rate. Oral intake has improved , . Speech therapist reevaluated patient today and recommended dysphagia 2 diet with honey thick liquids. Likely discharge to skilled nursing facility tomorrow  7. Agitation, likely delirium , now on amantadine  9. Hyperlipidemia with LDL 104 goal below 100, preferably below 70 , advance Lipitor   Management plans discussed with the patient, family and they are in agreement.   DRUG ALLERGIES:  Allergies  Allergen Reactions  . Prednisone Other (See Comments)    "blisters"  . Vioxx [Rofecoxib] Other (See Comments)    "bleeding of stomach"    CODE STATUS:     Code Status Orders        Start     Ordered   02/20/15 2231  Full code   Continuous     02/20/15 2230      TOTAL TIME TAKING CARE OF THIS PATIENT:  50 minutes.  Discussed this patient's family extensively , all questions answered. Time spent approximately for coordination of care 15 minutes   Gisela Lea M.D on  02/23/2015 at 2:59 PM  Between 7am to 6pm - Pager - [phone]  After 6pm go to [email or web address] - password [email or web address] [facility]  Office  [phone]  CC: Primary care physician; Albina Billet, MD

## 2015-02-23 NOTE — Plan of Care (Signed)
Problem: Safety: Goal: Ability to remain free from injury will improve Outcome: Progressing Patient calm and cooperative this shift.  Patient not communicative with staff or family.  Patient slept majority of shift.  Bed in lowest position with wheels locked.  Bed alarm on most sensitive setting throughout shift.  Patient without injury.  Problem: Self-Care: Goal: Ability to participate in self-care as condition permits will improve Outcome: Not Progressing Patient awake during assessment.  Left arm and leg are strong.  Grip and flexion present.  Right arm is flaccid and right leg has minimal resistance against gravity.    Problem: Nutrition: Goal: Risk of aspiration will decrease Outcome: Not Progressing Speech evaluated and ordered dysphasia 2 diet.  Patient did not eat or drink during this shift.  Fluids - Dextrose 5%-.45% sodium chloride solution infusing at 100 mL/hr.  Problem: Tissue Perfusion: Goal: Cerebral tissue perfusion will improve Outcome: Not Progressing No improvement in neurological status Goal: Complications of Ischemic Stroke will be minimized. Outcome: Progressing No worsening symptoms this shift.

## 2015-02-23 NOTE — Progress Notes (Signed)
Physical Therapy Treatment Patient Details Name: Tyler Hale MRN: [id number] DOB: 1927-01-12 Today's Date: 02/23/2015    History of Present Illness Pt is here with R sided weakness, found to have acute on chronic CVA with R sided weakness with expressive and receptive aphasia    PT Comments    Pt has a better PT session today, but continues to struggle to follow instructions and is very unsteady with standing/mobility. His wife is very helpful, but pt continues to be limited in his ability to consistently participate.  Pt is able to stand with better balance today but continues to be unsafe with standing and has trouble with exercises.  Follow Up Recommendations  SNF     Equipment Recommendations       Recommendations for Other Services       Precautions / Restrictions Precautions Precautions: Fall Restrictions Weight Bearing Restrictions: No    Mobility  Bed Mobility Overal bed mobility: Needs Assistance Bed Mobility: Supine to Sit;Sit to Supine     Supine to sit: Mod assist Sit to supine: Mod assist   General bed mobility comments: Pt better able to assist getting to EOB, but still requires considerable cuing and asssit  Transfers Overall transfer level: Needs assistance Equipment used: 1 person hand held assist;2 person hand held assist Transfers: Sit to/from Stand Sit to Stand: Min assist;Mod assist         General transfer comment: Pt leaning back and to the R again today, but not nearly as heavily and for brief moments only needs CGA with HHA to stay upright  Ambulation/Gait             General Gait Details: pt unable to ambulate with walker or with HHA, but is able to take 2 very unsteady side steps at EOB with heavy cuing and encouragement   Stairs            Wheelchair Mobility    Modified Rankin (Stroke Patients Only)       Balance                                    Cognition Arousal/Alertness:  Awake/alert Behavior During Therapy: Impulsive;Restless Overall Cognitive Status: Difficult to assess (wife reports he has seemed more interactive today)                      Exercises General Exercises - Lower Extremity Ankle Circles/Pumps: PROM;10 reps Heel Slides: AAROM;10 reps Hip ABduction/ADduction: AAROM;10 reps Straight Leg Raises: AAROM;10 reps    General Comments        Pertinent Vitals/Pain Pain Assessment:  (no indication of pain t/o session)    Home Living                      Prior Function            PT Goals (current goals can now be found in the care plan section) Progress towards PT goals: Progressing toward goals    Frequency  7X/week    PT Plan Current plan remains appropriate    Co-evaluation             End of Session Equipment Utilized During Treatment: Gait belt Activity Tolerance:  (mental status does limit his ability to participate) Patient left: with bed alarm set;with family/visitor present     Time: 1351-1418 PT Time Calculation (min) (ACUTE ONLY): 27  min  Charges:  $Therapeutic Exercise: 8-22 mins $Therapeutic Activity: 8-22 mins                    G Codes:     Wayne Both, PT, DPT [phone]  Tyler Hale 02/23/2015, 4:05 PM

## 2015-02-24 MED ORDER — MUPIROCIN 2 % EX OINT: 1.0000 "application " | TOPICAL_OINTMENT | Freq: Two times a day (BID) | CUTANEOUS | Status: AC

## 2015-02-24 MED ORDER — LORAZEPAM 2 MG/ML IJ SOLN
0.5000 mg | Freq: Once | INTRAMUSCULAR | Status: AC
  Administered 2015-02-24: 0.5 mg via INTRAVENOUS
  Filled 2015-02-24: qty 1

## 2015-02-24 MED ORDER — LORAZEPAM 2 MG/ML IJ SOLN: 1.0000 mg | Freq: Once | INTRAMUSCULAR | Status: DC

## 2015-02-24 MED ORDER — RISPERIDONE 0.5 MG PO TABS
1.0000 mg | ORAL_TABLET | Freq: Every day | ORAL | Status: DC
  Administered 2015-02-24 – 2015-02-26 (×3): 1 mg via ORAL
  Filled 2015-02-24 (×5): qty 2

## 2015-02-24 MED ORDER — ATORVASTATIN CALCIUM 80 MG PO TABS: 80.0000 mg | ORAL_TABLET | Freq: Every day | ORAL | Status: AC

## 2015-02-24 MED ORDER — ASPIRIN 81 MG PO CHEW: 81.0000 mg | CHEWABLE_TABLET | Freq: Every day | ORAL | Status: AC

## 2015-02-24 MED ORDER — TRAZODONE HCL 50 MG PO TABS
50.0000 mg | ORAL_TABLET | Freq: Every day | ORAL | Status: DC
  Administered 2015-02-24 – 2015-02-27 (×4): 50 mg via ORAL
  Filled 2015-02-24 (×5): qty 1

## 2015-02-24 MED ORDER — METOPROLOL TARTRATE 25 MG PO TABS
25.0000 mg | ORAL_TABLET | Freq: Two times a day (BID) | ORAL | Status: DC
  Administered 2015-02-24 – 2015-02-27 (×5): 25 mg via ORAL
  Filled 2015-02-24 (×9): qty 1

## 2015-02-24 MED ORDER — HALOPERIDOL LACTATE 5 MG/ML IJ SOLN
2.0000 mg | Freq: Four times a day (QID) | INTRAMUSCULAR | Status: DC | PRN
  Administered 2015-02-24 – 2015-02-25 (×2): 2 mg via INTRAVENOUS
  Filled 2015-02-24 (×2): qty 1

## 2015-02-24 MED ORDER — AMANTADINE HCL 100 MG PO CAPS: 100.0000 mg | ORAL_CAPSULE | Freq: Two times a day (BID) | ORAL | Status: AC

## 2015-02-24 NOTE — Plan of Care (Signed)
Problem: Education: Goal: Knowledge of [facility] Education information/materials will improve Outcome: Completed/Met Date Met:  02/24/15 Spoke to wife about discharge plan.    Problem: Safety: Goal: Ability to remain free from injury will improve Outcome: Progressing Pt  Became increasingly agitated s/p ativan for MRI.  Placed safety sitter in room secondary to pt climbing oob and possibility of him falling.

## 2015-02-24 NOTE — Evaluation (Signed)
Speech Language Pathology Cognitive Linguistic Evaluation (limited) Patient Details Name: Tyler [facility] MRN: [id number] DOB: [birth year] Today's Date: 02/24/2015 Time: [id number] SLP Time Calculation (min) (ACUTE ONLY): 25 min  Problem List:  Patient Active Problem List   Diagnosis Date Noted  . Stroke (cerebrum) ([facility]) 02/20/2015  . Altered mental status 10/31/2014  . Confusion 10/30/2014  . Esophageal reflux 11/06/2012   Past Medical History:  Past Medical History  Diagnosis Date  . GERD (gastroesophageal reflux disease)   . Brain injury ([facility]) 06/07/2012  . Cancer [facility]) 2004    bladder  . Cancer [facility])     prostate  . Arthritis   . Stroke [facility])    Past Surgical History:  Past Surgical History  Procedure Laterality Date  . Leg surgery Bilateral 2014  . Joint replacement Left     Total knee Replacement  . Hip pinning Left 1984    hit by tractor  . Fracture surgery Left     hip, jaw  . Green light laser turp (transurethral resection of prostate N/A 10/29/2014    Procedure: GREEN LIGHT LASER TURP (TRANSURETHRAL RESECTION OF PROSTATE;  Surgeon: Tyler Cowper, MD;  Location: [facility] ORS;  Service: Urology;  Laterality: N/A;   HPI:  Tyler Hale is a 81 y.o. male with a known history of gastric reflux, bladder cancer, prostate cancer, arthritis, recent stroke in October 2016 was admitted at [facility]- as the stroke happened near there and was taken over there he stayed in the [facility] for a few days, then was discharged to decrease versus for further rehabilitation. As per wife daughter and son who are present in the room, getting the last stroke he suffered from speech problem and right upper extremity weakness. He had a gradual recovery in his speech up to the level he was able to speak 3-4 word sentences and was able to come any get an answer to questions. But he was not able to move his right upper extremity. Yesterday at nursing home he stayed sleepy most of the time and did  not get up for physical therapy also wife also noticed his face is slightly deviated and looks weak on his right side, she drew attention of the nurses towards this, they spoke to the doctor on-call and as per them he started on some kind of blood thinner. Today morning when they tried to get him up for physical therapy he was not able to maintain his balance, and his right leg was very very weak. He also had lost his speech today again. As per family he never had any weakness on his lower extremities and was able to walk fine up until day before yesterday.   Assessment / Plan / Recommendation Clinical Impression    Pt given limited language assessment to evaluate expressive and receptive abilities at this time. Pt presented with real objects and asked to demonstrate function and to name objects (0/6 correct). Pt presented with pictures and asked to name them (0/3 correct). On naming of picture task, Pt stated "ka" for "key" indicating that phonemic paraphasias may be present. Pt unable to partake in automatic speech tasks even after modeling by ST. After ST said the days of the week, Pt said "Sunday" suggesting some awareness and reception of information. When ST stated days of week again, Pt did not speak or participate again. Pt was asked to count to 5, and moved lips in appropriate shape to make the "one" (lip rounding) but did not speak  or complete task. When ST modeled lip rounding for "one" on second trial of counting, Pt followed model and rounded lips, but when ST paused to see if Pt would speak -- Pt puckered lips and make kissing sound. Modeling may be an appropriate treatment technique along with maximal cues. ST incorporated errorless learning in limited assessment with no improvement in Pt performance (e.g., "this is a comb. What is this?" [comb]).   Pt would benefit from a full cognitive linguistic assessment and treatment to target deficits noted as well as any new deficits discovered in full  assessment. NSG updated.    SLP Assessment       Follow Up Recommendations  Skilled Nursing facility    Frequency and Duration           SLP Evaluation Prior Functioning  Cognitive/Linguistic Baseline: Within functional limits Type of Home: Mobile home  Lives With: Spouse Available Help at Discharge: Family   Cognition  Overall Cognitive Status: Impaired/Different from baseline Arousal/Alertness: Awake/alert Orientation Level: Disoriented to place;Disoriented to time;Disoriented to situation;Oriented to person (oriented to wife) Memory: Impaired    Comprehension    UTA   Expression Verbal Expression Overall Verbal Expression: Impaired Initiation: Impaired Automatic Speech:  (impaired, unable to complete automatic tasks at this time) Level of Generative/Spontaneous Verbalization: Word Repetition: Impaired Level of Impairment: Word level Naming: Impairment Responsive:  (limited assessment, could not name 5 objects) Confrontation: Impaired Verbal Errors: Phonemic paraphasias (one instance of phonemic paraphasia noted during ST visit) Pragmatics: Unable to assess Non-Verbal Means of Communication: Gestures (smile, wink) Other Verbal Expression Comments: Pt exhibits deficits in verbal expression c/b inability to name objects or demonstrate function, inability to complete automatic speech tasks, word level spontanous utterances only (not consistently appropriate to conversation). Written Expression Dominant Hand: Right Written Expression: Not tested   Oral / Motor      [facility] 02/24/2015, 12:21 PM

## 2015-02-24 NOTE — Progress Notes (Signed)
[place] [facility] [place] NAME: Tyler [facility]    MR#:  [id number]  DATE OF BIRTH:  [birth year]  SUBJECTIVE: Pt is not able to communicate properly.  CHIEF COMPLAINT:   Chief Complaint  Patient presents with  . Cerebrovascular Accident    right upper extremity weakness and receptive as well as his expressive aphasia  CT scan of the head revealed left parietal MCA distribution large stroke which was described as acute. MRI/MRA of brain is not done due to intolerance- tried again today.  Pt does not have good oral intake for last few days. So canceled discharge today.  Review of Systems  Unable to perform ROS: language    VITAL SIGNS: Blood pressure 154/78, pulse 85, temperature 97.3 F (36.3 C), temperature source Oral, resp. rate 18, height 5\' 10"  (1.778 m), weight 77.52 kg (170 lb 14.4 oz), SpO2 99 %.  PHYSICAL EXAMINATION:   GENERAL:  81 y.o.-year-old patient lying in the bed with no acute distress.  EYES: Pupils equal, round, reactive to light and accommodation. No scleral icterus. Extraocular muscles intact.  HEENT: Head atraumatic, normocephalic. Oropharynx and nasopharynx clear.  NECK:  Supple, no jugular venous distention. No thyroid enlargement, no tenderness.  LUNGS: Normal breath sounds bilaterally, no wheezing, rales,rhonchi or crepitation. No use of accessory muscles of respiration.  CARDIOVASCULAR: S1, S2 normal. No murmurs, rubs, or gallops.  ABDOMEN: Soft, nontender, nondistended. Bowel sounds present. No organomegaly or mass.  EXTREMITIES: No pedal edema, cyanosis, or clubbing.  NEUROLOGIC: Cranial nerves II through XII are intact. Muscle strength 0/5 in the right upper extremity, 4 out of 5 in right lower extremity, 5 out of 5 in left upper as well as lower extremities. Sensation not able to evaluate. Gait not checked.  PSYCHIATRIC: The patient is alert , but orientation can not be checked.  SKIN: No obvious rash,  lesion, or ulcer.   ORDERS/RESULTS REVIEWED:   CBC  Recent Labs Lab 02/20/15 1804 02/21/15 0636  WBC 9.9 9.6  HGB 12.2* 11.3*  HCT 37.0* 34.1*  PLT 287 264  MCV 85.1 84.4  MCH 28.1 27.9  MCHC 33.1 33.1  RDW 14.9* 14.4  LYMPHSABS 2.2  --   MONOABS 0.8  --   EOSABS 0.6  --   BASOSABS 0.1  --    ------------------------------------------------------------------------------------------------------------------  Chemistries   Recent Labs Lab 02/20/15 1804 02/21/15 0636 02/22/15 0513  NA 140 142 142  K 4.6 4.8 4.8  CL 113* 116* 116*  CO2 20* 21* 19*  GLUCOSE 107* 98 84  BUN 41* 36* 32*  CREATININE 1.94* 1.75* 1.64*  CALCIUM 8.6* 8.5* 8.9  AST 78*  --   --   ALT 81*  --   --   ALKPHOS 127*  --   --   BILITOT 0.4  --   --    ------------------------------------------------------------------------------------------------------------------ estimated creatinine clearance is 33.4 mL/min (by C-G formula based on Cr of 1.64). ------------------------------------------------------------------------------------------------------------------ No results for input(s): TSH, T4TOTAL, T3FREE, THYROIDAB in the last 72 hours.  Invalid input(s): FREET3  Cardiac Enzymes No results for input(s): CKMB, TROPONINI, MYOGLOBIN in the last 168 hours.  Invalid input(s): CK ------------------------------------------------------------------------------------------------------------------ Invalid input(s): POCBNP ---------------------------------------------------------------------------------------------------------------  RADIOLOGY: No results found.  ASSESSMENT AND PLAN:  Principal Problem:   Stroke (cerebrum) ([place]) 1. Acute stroke in the left MCA distribution with right-sided weakness, and improving right lower extremity weakness. MRI/MRA of brain  Not done due to patient's intolerance,  neurology consultation  is  appreciated  . Patient was seen by speech therapist and recommended  dysphagia diet .  amantadine per neurologist recommendations for agitation.   Does not have good oral intake for last 3 days.   Dietician consult. Hold discharge today.   If pt does not have enough oral intake - he may have to have PEG tube.  2. Essential hypertension, stable.    Metoprolol oral. 3. Acute on chronic renal failure, stable. Continue IV fluid administration as patient's oral intake is poor- renal func is improving. 4. Acidosis on BMP , . Compensated on  VBG 5. Anemia, follow with rehydration. 6. Dysphagia, continue patient on IV fluids At lower rate. Oral intake is poor ,    May need to decide his feeding way. May need PEG tube, if not start eating enough/  7. Agitation, likely delirium , now on amantadine  9. Hyperlipidemia with LDL 104 goal below 100, preferably below 70 , advance Lipitor   Management plans discussed with the patient, family and they are in agreement.   DRUG ALLERGIES:  Allergies  Allergen Reactions  . Prednisone Other (See Comments)    "blisters"  . Vioxx [Rofecoxib] Other (See Comments)    "bleeding of stomach"    CODE STATUS:     Code Status Orders        Start     Ordered   02/20/15 2231  Full code   Continuous     02/20/15 2230      TOTAL TIME TAKING CARE OF THIS PATIENT:  30 minutes.   Discussed this patient's family extensively , all questions answered. Time spent approximately for coordination of care 15 minutes   Vaughan Basta M.D on 02/24/2015 at 8:29 PM  Between 7am to 6pm - Pager - [phone]  After 6pm go to [email or web address] - password [email or web address] [facility]  Office  [phone]  CC: Primary care physician; Albina Billet, MD

## 2015-02-24 NOTE — Plan of Care (Signed)
Problem: Safety: Goal: Ability to remain free from injury will improve Outcome: Progressing Pt remains free from falls.  Bed alarm on, frequent stops in to check on pt.  Pt does not understand how to use call bell.  Bed in lowest position.  Problem: Pain Managment: Goal: General experience of comfort will improve Outcome: Progressing Pt does not appear to be in pain.  Restful night.  No complications.

## 2015-02-24 NOTE — Progress Notes (Signed)
Speech Language Pathology Treatment: Dysphagia  Patient Details Name: [facility] MRN: [id number] DOB: 1927/02/16 Today's Date: 02/24/2015 Time: [id number] SLP Time Calculation (min) (ACUTE ONLY): 25 min  Assessment / Plan / Recommendation Clinical Impression  Pt presents with no overt s/s of aspiration on trials of nectar thick liquids. Assessment was somewhat limited due to Pt's current cognitive status. Wife and ST used max cues to encourage sipping from cup. Pt consumed ~1.5 ounces of nectar thickened liquid and would benefit from an upgraded diet to include nectar at this time. Pt requires continuing dysphagia treatment as well as treatment for aphasia secondary to stroke. NSG and MD updated.   HPI HPI: [facility] is a 81 y.o. male with a known history of gastric reflux, bladder cancer, prostate cancer, arthritis, recent stroke in October 2016 was admitted at [facility]- as the stroke happened near there and was taken over there he stayed in the hospital for a few days, then was discharged to decrease versus for further rehabilitation. As per wife daughter and son who are present in the room, getting the last stroke he suffered from speech problem and right upper extremity weakness. He had a gradual recovery in his speech up to the level he was able to speak 3-4 word sentences and was able to come any get an answer to questions. But he was not able to move his right upper extremity. Yesterday at nursing home he stayed sleepy most of the time and did not get up for physical therapy also wife also noticed his face is slightly deviated and looks weak on his right side, she drew attention of the nurses towards this, they spoke to the doctor on-call and as per them he started on some kind of blood thinner. Today morning when they tried to get him up for physical therapy he was not able to maintain his balance, and his right leg was very very weak. He also had lost his speech today again. As per  family he never had any weakness on his lower extremities and was able to walk fine up until day before yesterday.      SLP Plan  Continue with current plan of care     Recommendations  Diet recommendations: Dysphagia 2 (fine chop);Nectar-thick liquid Liquids provided via: Cup;Teaspoon;No straw Medication Administration: Whole meds with puree Supervision: Staff to assist with self feeding Compensations: Minimize environmental distractions;Slow rate;Small sips/bites Postural Changes and/or Swallow Maneuvers: Seated upright 90 degrees              Oral Care Recommendations: Oral care BID;Staff/trained caregiver to provide oral care Follow up Recommendations: Skilled Nursing facility Plan: Continue with current plan of care   [facility] 02/24/2015, 12:14 PM

## 2015-02-24 NOTE — Plan of Care (Signed)
Problem: Physical Regulation: Goal: Ability to maintain clinical measurements within normal limits will improve Outcome: Adequate for Discharge Pt continues to not eat.  Minimal change noted due to pt's inability to understand questions

## 2015-02-24 NOTE — Plan of Care (Signed)
Problem: Nutrition: Goal: Risk of aspiration will decrease Outcome: Progressing Pt not eating but does drink nectar thick fluids occasionally.  No obvious signs of aspirations. Goal: Dietary intake will improve Outcome: Not Progressing Pt not eating

## 2015-02-24 NOTE — Clinical Social Work Note (Signed)
CSW updated family that pt is unable to go to [facility]. CSW intiated an [facility] search. Pt's wife does not want pt to return to [facility]. Only bed offer at time of note in [facility], which is not a choice of pt's family. CSW has been working with [facility] regarding a bed offer. None at time of note. Per RN pt's discharge is being held as pt has poor PO intake and he is still on IV fluid. CSW will continue to follow.   Darden Dates, MSW, LCSW Clinical Social Worker [phone]

## 2015-02-24 NOTE — Plan of Care (Signed)
Problem: Self-Care: Goal: Ability to participate in self-care as condition permits will improve Outcome: Not Progressing Pt is unable to car for self.

## 2015-02-24 NOTE — Plan of Care (Signed)
Problem: Tissue Perfusion: Goal: Cerebral tissue perfusion will improve Outcome: Progressing Pt's neurological decline or improvement noted.

## 2015-02-24 NOTE — Discharge Summary (Addendum)
[place] [facility] [place] NAME: Tyler Hale    MR#:  [id number]  DATE OF BIRTH:  03-14-1927  DATE OF ADMISSION:  02/20/2015 ADMITTING PHYSICIAN: Vaughan Basta, MD  DATE OF DISCHARGE: 02/27/2015  PRIMARY CARE PHYSICIAN: Albina Billet, MD    ADMISSION DIAGNOSIS:  CVA (cerebral infarction) [I63.9] Acute cerebrovascular accident (CVA) ([place]) [I63.9]  DISCHARGE DIAGNOSIS:  Principal Problem:   Stroke (cerebrum) ([place]) Active Problems:   CVA (cerebral infarction)   Dysphasia   Arrhythmia   Carotid stenosis   History of stroke   SECONDARY DIAGNOSIS:   Past Medical History  Diagnosis Date  . GERD (gastroesophageal reflux disease)   . Brain injury ([place]) 06/07/2012  . Cancer [facility]) 2004    bladder  . Cancer [facility])     prostate  . Arthritis   . Stroke ([place]) 10 &01/2015    HOSPITAL COURSE:   Stroke (cerebrum) ([place]) 1. Acute stroke in the left MCA distribution with right-sided weakness, and improving right lower extremity weakness. MRI/MRA of brain not done due to patient's intolerance, neurology consultation is appreciated . Patient was seen by speech therapist and recommended dysphagia diet . Oral intake has been limited, and HE NEED FULL ASSISANCE WITH FEEING. NEED TO BE REEVALUATED BY SPEECH AND SWALLOW IN 1-2 WEEKS. added amantadine per neurologist recommendations  he had some agitations earlier- but remained stable on meds- and without a safty sitter for 24 hours.  oral intake is gradually improving- Need re-assessment by swallow tech in few days to upgrade diet.  Need to have dietary supplements. 2. Essential hypertension, stable. No treatment unless above [id number] systolic, BP remained stable. 3. Acute on chronic renal failure, stable. Continued IV fluid administration as patient's oral intake is poor- creatinin appears at baseline. 4. Acidosis on BMP , . Compensated on VBG 5. Anemia, follow with rehydration 6.  Dysphagia, continue patient on IV fluidsAt lower rate. Oral intake has improved , . Speech therapist reevaluated patient today and recommended dysphagia 2 diet with honey thick liquids. Likely discharge to skilled nursing facility   We offered option of PEG tube to family- but his wife denied for that, I called son Mr.Rusty- explained him the condition and expectation of getting malnutrition or aspiration in near future. And plan to d/c to rehab- they agreed with the plan. 7. Agitation, likely delirium , now on amantadine  9. Hyperlipidemia with LDL 104 goal below 100, preferably below 70 , advance Lipitor    DISCHARGE CONDITIONS:   Stable.  CONSULTS OBTAINED:  Treatment Team:  Minna Merritts, MD  DRUG ALLERGIES:   Allergies  Allergen Reactions  . Prednisone Other (See Comments)    "blisters"  . Vioxx [Rofecoxib] Other (See Comments)    "bleeding of stomach"    DISCHARGE MEDICATIONS:   Current Discharge Medication List    START taking these medications   Details  amantadine (SYMMETREL) 100 MG capsule Take 1 capsule (100 mg total) by mouth 2 (two) times daily. Qty: 14 capsule, Refills: 0    aspirin 81 MG chewable tablet Chew 1 tablet (81 mg total) by mouth daily. Qty: 30 tablet, Refills: 0    metoprolol tartrate (LOPRESSOR) 25 MG tablet Take 1 tablet (25 mg total) by mouth 2 (two) times daily. Qty: 30 tablet, Refills: 0    mupirocin ointment (BACTROBAN) 2 % Place 1 application into the nose 2 (two) times daily. Qty: 22 g, Refills: 0    !! traZODone (DESYREL) 50 MG  tablet Take 1 tablet (50 mg total) by mouth at bedtime as needed for sleep. Qty: 15 tablet, Refills: 0     !! - Potential duplicate medications found. Please discuss with provider.    CONTINUE these medications which have CHANGED   Details  atorvastatin (LIPITOR) 80 MG tablet Take 1 tablet (80 mg total) by mouth daily at 6 PM. Qty: 30 tablet, Refills: 0    !! risperiDONE (RISPERDAL) 1 MG tablet Take 1  tablet (1 mg total) by mouth at bedtime. Qty: 20 tablet, Refills: 0     !! - Potential duplicate medications found. Please discuss with provider.    CONTINUE these medications which have NOT CHANGED   Details  cholecalciferol (VITAMIN D) 1000 UNITS tablet Take 1,000 Units by mouth daily.    !! risperiDONE (RISPERDAL) 1 MG tablet Take 1 mg by mouth every 8 (eight) hours as needed.    traMADol (ULTRAM) 50 MG tablet Take by mouth every 6 (six) hours as needed.    !! traZODone (DESYREL) 50 MG tablet Take 50 mg by mouth at bedtime.    vitamin B-12 (CYANOCOBALAMIN) 1000 MCG tablet Take 1,000 mcg by mouth daily.     !! - Potential duplicate medications found. Please discuss with provider.    STOP taking these medications     aspirin EC 81 MG tablet      metroNIDAZOLE (FLAGYL) 500 MG tablet          DISCHARGE INSTRUCTIONS:    Diet recommendations   Recommendations  Diet recommendations: Dysphagia 2 (fine chop);Nectar-thick liquid Liquids provided via: Cup;Teaspoon;No straw Medication Administration: Whole meds with puree Supervision: Staff to assist with self feeding Compensations: Minimize environmental distractions;Slow rate;Small sips/bites Postural Changes and/or Swallow Maneuvers: Seated upright 90 degrees              Oral Care Recommendations: Oral care BID;Staff/trained caregiver to provide oral care Follow up Recommendations: Skilled Nursing facility Plan: Continue with current plan of care       FULL ASSISTANCE WITH FEEDING AND RE-EVALUATE IN 1-2 WEEKS BY SWALLOW TECH.  If you experience worsening of your admission symptoms, develop shortness of breath, life threatening emergency, suicidal or homicidal thoughts you must seek medical attention immediately by calling 911 or calling your MD immediately  if symptoms less severe.  You Must read complete instructions/literature along with all the possible adverse reactions/side effects for all the Medicines  you take and that have been prescribed to you. Take any new Medicines after you have completely understood and accept all the possible adverse reactions/side effects.   Please note  You were cared for by a hospitalist during your hospital stay. If you have any questions about your discharge medications or the care you received while you were in the hospital after you are discharged, you can call the unit and asked to speak with the hospitalist on call if the hospitalist that took care of you is not available. Once you are discharged, your primary care physician will handle any further medical issues. Please note that NO REFILLS for any discharge medications will be authorized once you are discharged, as it is imperative that you return to your primary care physician (or establish a relationship with a primary care physician if you do not have one) for your aftercare needs so that they can reassess your need for medications and monitor your lab values.    Today   CHIEF COMPLAINT:   Chief Complaint  Patient presents with  . Cerebrovascular  Accident    HISTORY OF PRESENT ILLNESS:  Tyler Hale  is a 81 y.o. male with a known history of gastric reflux, bladder cancer, prostate cancer, arthritis, recent stroke in October 2016 was admitted at [facility]- as the stroke happened near there and was taken over there he stayed in the hospital for a few days, then was discharged to decrease versus for further rehabilitation. As per wife daughter and son who are present in the room, getting the last stroke he suffered from speech problem and right upper extremity weakness. He had a gradual recovery in his speech up to the level he was able to speak 3-4 word sentences and was able to come any get an answer to questions. But he was not able to move his right upper extremity. Yesterday at nursing home he stayed sleepy most of the time and did not get up for physical therapy also wife also noticed his face is  slightly deviated and looks weak on his right side, she drew attention of the nurses towards this, they spoke to the doctor on-call and as per them he started on some kind of blood thinner. Today morning when they tried to get him up for physical therapy he was not able to maintain his balance, and his right leg was very very weak. He also had lost his speech today again. As per family he never had any weakness on his lower extremities and was able to walk fine up until day before yesterday. As per family because of his swallowing difficulty they're giving his aspirin and other medications crushed in applesauce and he was taking them regularly. Patient is not able to understand workup indicated with me currently because of speech issue so all this history obtained from his wife son and daughter who were present in the room and I reviewed the records available in our system from [facility] admission last month.  VITAL SIGNS:  Blood pressure 159/79, pulse 70, temperature 97.9 F (36.6 C), temperature source Oral, resp. rate 17, height 5\' 10"  (1.778 m), weight 77.52 kg (170 lb 14.4 oz), SpO2 96 %.  I/O:    Intake/Output Summary (Last 24 hours) at 02/27/15 1217 Last data filed at 02/26/15 2300  Gross per 24 hour  Intake      0 ml  Output    350 ml  Net   -350 ml    PHYSICAL EXAMINATION:   GENERAL: 81 y.o.-year-old patient lying in the bed with no acute distress.  EYES: Pupils equal, round, reactive to light and accommodation. No scleral icterus. Extraocular muscles intact.  HEENT: Head atraumatic, normocephalic. Oropharynx and nasopharynx clear.  NECK: Supple, no jugular venous distention. No thyroid enlargement, no tenderness.  LUNGS: Normal breath sounds bilaterally, no wheezing, rales,rhonchi or crepitation. No use of accessory muscles of respiration.  CARDIOVASCULAR: S1, S2 normal. No murmurs, rubs, or gallops.  ABDOMEN: Soft, nontender, nondistended. Bowel sounds present. No organomegaly  or mass.  EXTREMITIES: No pedal edema, cyanosis, or clubbing.  NEUROLOGIC: Cranial nerves II through XII are intact. Muscle strength 0/5 in the right upper extremity, 4 out of 5 in right lower extremity, 5 out of 5 in left upper as well as lower extremities. Sensation not able to evaluate. Gait not checked.  PSYCHIATRIC: The patient is alert , NOT SPEAKING. Hard to check orientation.  SKIN: No obvious rash, lesion, or ulcer.   DATA REVIEW:   CBC  Recent Labs Lab 02/26/15 0529  WBC 10.1  HGB 12.1*  HCT 37.3*  PLT 269    Chemistries   Recent Labs Lab 02/20/15 1804  02/26/15 0529  NA 140  < > 138  K 4.6  < > 4.2  CL 113*  < > 112*  CO2 20*  < > 21*  GLUCOSE 107*  < > 115*  BUN 41*  < > 21*  CREATININE 1.94*  < > 1.47*  CALCIUM 8.6*  < > 8.1*  AST 78*  --   --   ALT 81*  --   --   ALKPHOS 127*  --   --   BILITOT 0.4  --   --   < > = values in this interval not displayed.  Cardiac Enzymes No results for input(s): TROPONINI in the last 168 hours.  Microbiology Results  Results for orders placed or performed during the hospital encounter of 02/20/15  MRSA PCR Screening     Status: Abnormal   Collection Time: 02/21/15 12:23 AM  Result Value Ref Range Status   MRSA by PCR POSITIVE (A) NEGATIVE Final    Comment:        The GeneXpert MRSA Assay (FDA approved for NASAL specimens only), is one component of a comprehensive MRSA colonization surveillance program. It is not intended to diagnose MRSA infection nor to guide or monitor treatment for MRSA infections. READ BACK AND VERIFIED BY [place] @0211  02/21/15.Marland KitchenMarland KitchenAJO     RADIOLOGY:  No results found.    Management plans discussed with the patient, family and they are in agreement.  CODE STATUS:     Code Status Orders        Start     Ordered   02/20/15 2231  Full code   Continuous     02/20/15 2230      TOTAL TIME TAKING CARE OF THIS PATIENT: 35 minutes.    Vaughan Basta M.D on  02/27/2015 at 12:17 PM  Between 7am to 6pm - Pager - [phone]  After 6pm go to [email or web address] - password [email or web address] [facility]  Office  [phone]  CC: Primary care physician; Albina Billet, MD   Note: This dictation was prepared with Dragon dictation along with smaller phrase technology. Any transcriptional errors that result from this process are unintentional.

## 2015-02-24 NOTE — Care Management Important Message (Signed)
Important Message  Patient Details  Name: Tyler Hale MRN: [id number] Date of Birth: 02-01-1927   Medicare Important Message Given:  Yes    Shelbie Ammons, RN 02/24/2015, 11:09 AM

## 2015-02-24 NOTE — Progress Notes (Signed)
Unable to perform MRI

## 2015-02-24 NOTE — Clinical Documentation Improvement (Addendum)
Internal Medicine  (Please document query responses in the progress notes and discharge summary.  Responses documented on the CDI BPA cannot be coded because the CDI BPA is not part of the permanent medical record.)  Abnormal test results cannot be coded unless the clinical significance of the abnormal results are documented by the attending physician.  Please document the clinical significance, if any, of the "Cytotoxic Edema" noted on the CT of the Head report dated 02/20/15 at [facility], including any associated condition(s) and treatment(s)   Please exercise your independent, professional judgment when responding. A specific answer is not anticipated or expected.   Thank You,  Erling Conte  RN BSN CCDS [phone] Health Information Management [place]

## 2015-02-24 NOTE — Progress Notes (Signed)
Physical Therapy Treatment Patient Details Name: Tyler Hale MRN: [id number] DOB: February 06, 1927 Today's Date: 02/24/2015    History of Present Illness Pt is here with R sided weakness, found to have acute on chronic CVA with R sided weakness with expressive and receptive aphasia    PT Comments    Pt initially sleeping, but easily awoken and agreeable to PT. Pt continues to demonstrate deficits with following verbalized instructions, but responds well to gesturing. Improved bed mobility requiring less assist. STS transfers with decreasing assist as well, but once in stand, unable to effectively bear weight through right lower extremity with inability to maintain R knee in extension. Pt fatigues quickly in stand with mild impulsive need to sit. Continue PT for progression of strength, endurance and safety for improved functional mobility.   Follow Up Recommendations  SNF     Equipment Recommendations       Recommendations for Other Services       Precautions / Restrictions Precautions Precautions: Fall Restrictions Weight Bearing Restrictions: No    Mobility  Bed Mobility Overal bed mobility: Needs Assistance Bed Mobility: Supine to Sit;Sit to Supine     Supine to sit: Min assist Sit to supine: Min guard   General bed mobility comments: More assist supine to sit; does not follow cues to scoot to edge of bed, but eventually does so on his own after sitting several minutes with feet unsupported. With gesturing returns to bed with little assist  Transfers Overall transfer level: Needs assistance Equipment used: Rolling walker (2 wheeled) Transfers: Sit to/from Stand Sit to Stand: Min assist (unable to maintain RUE on rw; poor control RLE)         General transfer comment: With gesturing, performs transfer with Min A primarily using LUE/LE. Unable to maintain RLE in knee extension for proper weightbearing. Performed 2x  Ambulation/Gait             General Gait  Details: Unable   Stairs            Wheelchair Mobility    Modified Rankin (Stroke Patients Only)       Balance Overall balance assessment: Needs assistance Sitting-balance support: Single extremity supported;Feet supported Sitting balance-Leahy Scale: Good     Standing balance support: Single extremity supported Standing balance-Leahy Scale: Poor                      Cognition Arousal/Alertness: Awake/alert Behavior During Therapy: WFL for tasks assessed/performed Overall Cognitive Status: Impaired/Different from baseline Area of Impairment: Following commands;Safety/judgement;Awareness       Following Commands: Follows one step commands inconsistently (with tactile cues and gesturing) Safety/Judgement: Decreased awareness of deficits;Decreased awareness of safety          Exercises General Exercises - Lower Extremity Ankle Circles/Pumps: AAROM;Both;20 reps;Supine Quad Sets: Other (comment) (attempted; unable to follow instructions) Short Arc Quad: AAROM;Both;20 reps;Supine Long Arc Quad: AAROM;Both;20 reps;Seated Heel Slides: AAROM;Both;20 reps;Supine Hip ABduction/ADduction: AAROM;Both;20 reps;Supine Hip Flexion/Marching: AAROM;Both;20 reps;Seated    General Comments        Pertinent Vitals/Pain      Home Living                      Prior Function            PT Goals (current goals can now be found in the care plan section) Progress towards PT goals: Progressing toward goals    Frequency  7X/week    PT Plan Current  plan remains appropriate    Co-evaluation             End of Session Equipment Utilized During Treatment: Gait belt Activity Tolerance: Patient tolerated treatment well (limited by receptive aphasia and R sided weakness) Patient left: in bed;with call bell/phone within reach;with bed alarm set;with family/visitor present;Other (comment) (MD in the room)     Time: [id number] PT Time Calculation (min)  (ACUTE ONLY): 30 min  Charges:  $Therapeutic Exercise: 8-22 mins $Therapeutic Activity: 8-22 mins                    G Codes:      Charlaine Dalton 02/24/2015, 10:35 AM

## 2015-02-24 NOTE — Plan of Care (Signed)
Problem: Nutrition: Goal: Adequate nutrition will be maintained Outcome: Not Progressing Pt not eating.  Have attempted to feed him and he refuses to open his mouth when attempting to feed.  Will not eat for his family.  Dr Anselm Jungling aware

## 2015-02-25 ENCOUNTER — Encounter: Payer: Self-pay | Admitting: Physician Assistant

## 2015-02-25 DIAGNOSIS — I639 Cerebral infarction, unspecified: Secondary | ICD-10-CM | POA: Insufficient documentation

## 2015-02-25 DIAGNOSIS — R4702 Dysphasia: Secondary | ICD-10-CM

## 2015-02-25 DIAGNOSIS — Z8673 Personal history of transient ischemic attack (TIA), and cerebral infarction without residual deficits: Secondary | ICD-10-CM

## 2015-02-25 DIAGNOSIS — I6529 Occlusion and stenosis of unspecified carotid artery: Secondary | ICD-10-CM | POA: Insufficient documentation

## 2015-02-25 DIAGNOSIS — I63412 Cerebral infarction due to embolism of left middle cerebral artery: Principal | ICD-10-CM

## 2015-02-25 DIAGNOSIS — I6523 Occlusion and stenosis of bilateral carotid arteries: Secondary | ICD-10-CM

## 2015-02-25 DIAGNOSIS — I499 Cardiac arrhythmia, unspecified: Secondary | ICD-10-CM | POA: Insufficient documentation

## 2015-02-25 LAB — BASIC METABOLIC PANEL
ANION GAP: 5 (ref 5–15)
BUN: 20 mg/dL (ref 6–20)
CALCIUM: 8.2 mg/dL — AB (ref 8.9–10.3)
CO2: 19 mmol/L — AB (ref 22–32)
CREATININE: 1.57 mg/dL — AB (ref 0.61–1.24)
Chloride: 115 mmol/L — ABNORMAL HIGH (ref 101–111)
GFR calc non Af Amer: 38 mL/min — ABNORMAL LOW (ref >60)
GFR, EST AFRICAN AMERICAN: 44 mL/min — AB (ref >60)
GLUCOSE: 109 mg/dL — AB (ref 65–99)
Potassium: 3.9 mmol/L (ref 3.5–5.1)
SODIUM: 139 mmol/L (ref 135–145)

## 2015-02-25 MED ORDER — ENOXAPARIN SODIUM 40 MG/0.4ML ~~LOC~~ SOLN
40.0000 mg | SUBCUTANEOUS | Status: DC
  Administered 2015-02-25 – 2015-02-27 (×3): 40 mg via SUBCUTANEOUS
  Filled 2015-02-25 (×4): qty 0.4

## 2015-02-25 MED ORDER — ACETAMINOPHEN 325 MG PO TABS
650.0000 mg | ORAL_TABLET | Freq: Four times a day (QID) | ORAL | Status: DC | PRN
  Filled 2015-02-25: qty 2

## 2015-02-25 NOTE — Progress Notes (Addendum)
Patient had no c/o pain this shift Sitter at beside Ate well for dinner only  VSS, expect showed new second degree HB on tele, MD called and aware cardiology consult ordered  Used urinal and is incontinent   Per MD note Patient may benefit from PEG tube or hospice which can be discussed with palliative care.
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
## 2015-02-25 NOTE — Plan of Care (Signed)
Problem: Safety: Goal: Ability to remain free from injury will improve Outcome: Progressing Pt remains free from injury.  Safety sitter in place after much agitation today.  Bed in lowest position.

## 2015-02-25 NOTE — Clinical Social Work Note (Signed)
Clinical Social Worker followed up with [facility] regarding bed offer. No offer as of note. CSW will continue to follow.   Darden Dates, MSW, LCSW Clinical Social Worker [phone]

## 2015-02-25 NOTE — Progress Notes (Signed)
[facility] [place] NAME: Tyler Hale    MR#:  [id number]  DATE OF BIRTH:  12-Apr-1926  SUBJECTIVE: : patient is lethargic in bed  Patient was  combative overnight and received Haldol and Ativan Patient again unable to tolerate MRI  Patient had no oral intake yesterday  Also reviewed telemetry which shows 2nd degree AV block type 1   Review of Systems  Unable to perform ROS: acuity of condition    VITAL SIGNS: Blood pressure 153/82, pulse 92, temperature 97.3 F (36.3 C), temperature source Oral, resp. rate 18, height 5\' 10"  (1.778 m), weight 77.52 kg (170 lb 14.4 oz), SpO2 97 %.  PHYSICAL EXAMINATION:   GENERAL:  81 y.o.-year-old patient lying in the bed sedated sitter at bedside.  EYES:sluggish pupils are reactive 3 mm   HEENT: Head atraumatic, normocephalic. Oropharynx and nasopharynx dryNECK:  Supple, no jugular venous distention. No thyroid enlargement, no tenderness.  LUNGS: Normal breath sounds bilaterally, no wheezing, rales,rhonchi or crepitation. No use of accessory muscles of respiration.  CARDIOVASCULAR: irregular, regular . No murmurs, rubs, or gallops.  ABDOMEN: Soft, nontender, nondistended. Bowel sounds present. No organomegaly or mass.  EXTREMITIES: No pedal edema, cyanosis, or clubbing.  NEUROLOGIC: cannot do a good neuro eval as patient sedated  PSYCHIATRIC: somnolent SKIN: No obvious rash, lesion, or ulcer.   ORDERS/RESULTS REVIEWED:   CBC  Recent Labs Lab 02/20/15 1804 02/21/15 0636  WBC 9.9 9.6  HGB 12.2* 11.3*  HCT 37.0* 34.1*  PLT 287 264  MCV 85.1 84.4  MCH 28.1 27.9  MCHC 33.1 33.1  RDW 14.9* 14.4  LYMPHSABS 2.2  --   MONOABS 0.8  --   EOSABS 0.6  --   BASOSABS 0.1  --    ------------------------------------------------------------------------------------------------------------------  Chemistries   Recent Labs Lab 02/20/15 1804 02/21/15 0636 02/22/15 0513 02/25/15 0515  NA  140 142 142 139  K 4.6 4.8 4.8 3.9  CL 113* 116* 116* 115*  CO2 20* 21* 19* 19*  GLUCOSE 107* 98 84 109*  BUN 41* 36* 32* 20  CREATININE 1.94* 1.75* 1.64* 1.57*  CALCIUM 8.6* 8.5* 8.9 8.2*  AST 78*  --   --   --   ALT 81*  --   --   --   ALKPHOS 127*  --   --   --   BILITOT 0.4  --   --   --    ------------------------------------------------------------------------------------------------------------------ estimated creatinine clearance is 34.9 mL/min (by C-G formula based on Cr of 1.57). ------------------------------------------------------------------------------------------------------------------ No results for input(s): TSH, T4TOTAL, T3FREE, THYROIDAB in the last 72 hours.  Invalid input(s): FREET3  Cardiac Enzymes No results for input(s): CKMB, TROPONINI, MYOGLOBIN in the last 168 hours.  Invalid input(s): CK ------------------------------------------------------------------------------------------------------------------ Invalid input(s): POCBNP ---------------------------------------------------------------------------------------------------------------  RADIOLOGY: No results found.  ASSESSMENT AND PLAN:  Principal Problem:   Stroke (cerebrum) ([place]) 1. Acute stroke in the left MCA distribution with right-sided weakness: Patient had previous echocardiogram and carotid Dopplers at [facility] for the stroke. Patient is not doing well from his stroke. He seems agitated and is unable to take in any by mouth's. Patient will need palliative care consult. Patient may benefit from PEG tube or hospice which can be discussed with palliative care.  2. Essential hypertension, stable.   Continue metoprolol   3. Acute on chronic renal failure, stable. Continue IV fluid administration as patient's oral intake is poor- renal func is improving. 4. Acidosis on BMP , . Compensated  on  VBG 5. Anemia in chronic disease, 6. Dysphagia: Palliative care consult to decide if the family would like  PEG tube. 7. Agitation, likely delirium , now on amantadine with when necessary Haldol and Ativan 9. Hyperlipidemia with LDL 104 goal below 100, preferably below 70 , continue Lipitor   10. Second degree AV block type I: Without cardiology consultation and continue telemetry monitoring.   DRUG ALLERGIES:  Allergies  Allergen Reactions  . Prednisone Other (See Comments)    "blisters"  . Vioxx [Rofecoxib] Other (See Comments)    "bleeding of stomach"    CODE STATUS:     Code Status Orders        Start     Ordered   02/20/15 2231  Full code   Continuous     02/20/15 2230      TOTAL TIME TAKING CARE OF THIS PATIENT:  25 minutes.     Kenly Xiao M.D on 02/25/2015 at 11:13 AM  Between 7am to 6pm - Pager - [phone]  After 6pm go to [email or web address] - password [email or web address] [facility]  Office  [phone]  CC: Primary care physician; Albina Billet, MD

## 2015-02-26 LAB — CBC
HEMATOCRIT: 37.3 % — AB (ref 40.0–52.0)
HEMOGLOBIN: 12.1 g/dL — AB (ref 13.0–18.0)
MCH: 27.4 pg (ref 26.0–34.0)
MCHC: 32.4 g/dL (ref 32.0–36.0)
MCV: 84.4 fL (ref 80.0–100.0)
PLATELETS: 269 10*3/uL (ref 150–440)
RBC: 4.42 MIL/uL (ref 4.40–5.90)
RDW: 14.6 % — ABNORMAL HIGH (ref 11.5–14.5)
WBC: 10.1 10*3/uL (ref 3.8–10.6)

## 2015-02-26 LAB — BASIC METABOLIC PANEL
ANION GAP: 5 (ref 5–15)
BUN: 21 mg/dL — ABNORMAL HIGH (ref 6–20)
CHLORIDE: 112 mmol/L — AB (ref 101–111)
CO2: 21 mmol/L — AB (ref 22–32)
Calcium: 8.1 mg/dL — ABNORMAL LOW (ref 8.9–10.3)
Creatinine, Ser: 1.47 mg/dL — ABNORMAL HIGH (ref 0.61–1.24)
GFR calc non Af Amer: 41 mL/min — ABNORMAL LOW (ref >60)
GFR, EST AFRICAN AMERICAN: 48 mL/min — AB (ref >60)
Glucose, Bld: 115 mg/dL — ABNORMAL HIGH (ref 65–99)
POTASSIUM: 4.2 mmol/L (ref 3.5–5.1)
SODIUM: 138 mmol/L (ref 135–145)

## 2015-02-26 NOTE — Care Management Important Message (Signed)
Important Message  Patient Details  Name: JAGAN VODA MRN: [id number] Date of Birth: Aug 01, 1926   Medicare Important Message Given:  Yes    Shelbie Ammons, RN 02/26/2015, 12:59 PM

## 2015-02-26 NOTE — Progress Notes (Signed)
[place] [facility] [place] NAME: Tyler Hale    MR#:  [id number]  DATE OF BIRTH:  Aug 06, 1926  SUBJECTIVE: : patient is lethargic in bed  He did not receive any sedative meds last night. Did not eat anything for  Last 2-3 days.   Review of Systems  Unable to perform ROS: acuity of condition    VITAL SIGNS: Blood pressure 121/59, pulse 71, temperature 97.9 F (36.6 C), temperature source Oral, resp. rate 17, height 5\' 10"  (1.778 m), weight 77.52 kg (170 lb 14.4 oz), SpO2 96 %.  PHYSICAL EXAMINATION:   GENERAL:  81 y.o.-year-old patient lying in the bed drowsy, sitter at bedside.  EYES:sluggish pupils are reactive 3 mm   HEENT: Head atraumatic, normocephalic. Oropharynx and nasopharynx dryNECK:  Supple, no jugular venous distention. No thyroid enlargement, no tenderness.  LUNGS: Normal breath sounds bilaterally, no wheezing, rales,rhonchi or crepitation. No use of accessory muscles of respiration.  CARDIOVASCULAR: irregular, regular . No murmurs, rubs, or gallops.  ABDOMEN: Soft, nontender, nondistended. Bowel sounds present. No organomegaly or mass.  EXTREMITIES: No pedal edema, cyanosis, or clubbing.  NEUROLOGIC: cannot do a good neuro eval as patient drowsy.  PSYCHIATRIC: somnolent SKIN: No obvious rash, lesion, or ulcer.   ORDERS/RESULTS REVIEWED:   CBC  Recent Labs Lab 02/20/15 1804 02/21/15 0636 02/26/15 0529  WBC 9.9 9.6 10.1  HGB 12.2* 11.3* 12.1*  HCT 37.0* 34.1* 37.3*  PLT 287 264 269  MCV 85.1 84.4 84.4  MCH 28.1 27.9 27.4  MCHC 33.1 33.1 32.4  RDW 14.9* 14.4 14.6*  LYMPHSABS 2.2  --   --   MONOABS 0.8  --   --   EOSABS 0.6  --   --   BASOSABS 0.1  --   --    ------------------------------------------------------------------------------------------------------------------  Chemistries   Recent Labs Lab 02/20/15 1804 02/21/15 0636 02/22/15 0513 02/25/15 0515 02/26/15 0529  NA 140 142 142 139 138  K  4.6 4.8 4.8 3.9 4.2  CL 113* 116* 116* 115* 112*  CO2 20* 21* 19* 19* 21*  GLUCOSE 107* 98 84 109* 115*  BUN 41* 36* 32* 20 21*  CREATININE 1.94* 1.75* 1.64* 1.57* 1.47*  CALCIUM 8.6* 8.5* 8.9 8.2* 8.1*  AST 78*  --   --   --   --   ALT 81*  --   --   --   --   ALKPHOS 127*  --   --   --   --   BILITOT 0.4  --   --   --   --    ------------------------------------------------------------------------------------------------------------------ estimated creatinine clearance is 37.2 mL/min (by C-G formula based on Cr of 1.47). ------------------------------------------------------------------------------------------------------------------ No results for input(s): TSH, T4TOTAL, T3FREE, THYROIDAB in the last 72 hours.  Invalid input(s): FREET3  Cardiac Enzymes No results for input(s): CKMB, TROPONINI, MYOGLOBIN in the last 168 hours.  Invalid input(s): CK ------------------------------------------------------------------------------------------------------------------ Invalid input(s): POCBNP ---------------------------------------------------------------------------------------------------------------  RADIOLOGY: No results found.  ASSESSMENT AND PLAN:  Principal Problem:   Stroke (cerebrum) ([place]) Active Problems:   CVA (cerebral infarction)   Dysphasia   Arrhythmia   Carotid stenosis   History of stroke 1. Acute stroke in the left MCA distribution with right-sided weakness:    Patient had previous echocardiogram and carotid Dopplers at [facility] for the stroke. Patient is not doing well from his stroke. He seems agitated and is unable to take in any by mouth's  need palliative care consult. Patient may benefit  from PEG tube or hospice which can be discussed with palliative care.  today- his wife denied feeding tube, but she want him to be Full code.  2. Essential hypertension, stable.   Continue metoprolol   3. Acute on chronic renal failure, stable. Continue IV fluid  administration as patient's oral intake is poor- renal func improved. 4. Acidosis on BMP , . Compensated on  VBG 5. Anemia in chronic disease, 6. Dysphagia: Palliative care consult to help, family denied PEG tube. 7. Agitation, likely delirium , now on amantadine with when necessary Haldol and Ativan, pt is very drowsy without that. 9. Hyperlipidemia with LDL 104 goal below 100, preferably below 70 , continue Lipitor     Though we are not able to give many oral meds.    DRUG ALLERGIES:  Allergies  Allergen Reactions  . Prednisone Other (See Comments)    "blisters"  . Vioxx [Rofecoxib] Other (See Comments)    "bleeding of stomach"    CODE STATUS:     Code Status Orders        Start     Ordered   02/20/15 2231  Full code   Continuous     02/20/15 2230      TOTAL TIME TAKING CARE OF THIS PATIENT:  25 minutes.     Vaughan Basta M.D on 02/26/2015 at 9:00 PM  Between 7am to 6pm - Pager - [phone]  After 6pm go to [email or web address] - password [email or web address] [facility]  Office  [phone]  CC: Primary care physician; Albina Billet, MD

## 2015-02-26 NOTE — Progress Notes (Signed)
Speech Therapy Note:  Spoke with NSG, sitter and CNAs. Pt consumed pancakes around 11:30 for an early lunch. Sitter and wife report he finished entire plate of pancakes with no overt or immediate s/s of aspiration. Pt is resting now, sitter will give magic cup this afternoon once Pt is awake again. NSG updated. ST available for f/u while Pt is admitted.

## 2015-02-26 NOTE — Progress Notes (Signed)
Physical Therapy Treatment Patient Details Name: Tyler Hale MRN: [id number] DOB: [birth year] Today's Date: 02/26/2015    History of Present Illness Pt is here with R sided weakness, found to have acute on chronic CVA with R sided weakness with expressive and receptive aphasia    PT Comments    Pt sleeping, but able to awaken this afternoon. Pt agreeable to sitting up/standing. With sitting tolerance, pt unable to follow commands to actively participate with lower extremity exercises even with assist/cues. Tolerates sitting well with assist initially, but able to self support after a couple of minutes. Does not tolerate challenges/or movement of lower extremities without trunk assist. STS performed with improved endurance up to 45 seconds. During sitting rest break, pt has sudden urge to urinate and attempt to use urinal, but is unable to effectively remove brief fully causing inability to use urinal correctly. Pt required complete change and personal hygiene; therapist and sitter were able to do so in stand and sit positions. Suggested to leave brief loose while pt is awake to allow for use of urinal and attach brief securely when asleep, as pt is incontinent in sleep. Continue PT to progress strength, endurance, and safety awareness to improve functional mobility.  Follow Up Recommendations  SNF     Equipment Recommendations       Recommendations for Other Services       Precautions / Restrictions Restrictions Weight Bearing Restrictions: No    Mobility  Bed Mobility Overal bed mobility: Needs Assistance Bed Mobility: Supine to Sit;Sit to Supine     Supine to sit: Min assist Sit to supine: Min assist   General bed mobility comments: Max A x 2 for repositioning upward in bed.   Transfers Overall transfer level: Needs assistance Equipment used: Rolling walker (2 wheeled) Transfers: Sit to/from Stand Sit to Stand: Min assist;+2 safety/equipment (assist to protect flaccid  RUE)         General transfer comment: visual cue of rw and gestures performs STS; encouragement and tactile cues to remain in stand. Performed 3x up to 45 seconds 2 trials; 30 seconds for 1  Ambulation/Gait             General Gait Details: unable   Stairs            Wheelchair Mobility    Modified Rankin (Stroke Patients Only)       Balance Overall balance assessment: Needs assistance Sitting-balance support: Single extremity supported;Feet supported Sitting balance-Leahy Scale: Fair     Standing balance support: Single extremity supported Standing balance-Leahy Scale: Poor Standing balance comment: RLE buckles at times; trunk flexes requiring tactile cues to stand upright                    Cognition     Overall Cognitive Status: Impaired/Different from baseline                 General Comments: globally aphasic; follows some gestures    Exercises Other Exercises Other Exercises: with sitting tolerance attempted aarom of BLEs; unable to follow commands. Only PROM for hip flexion and knee extension Other Exercises: Stand tolerance x 3    General Comments        Pertinent Vitals/Pain Pain Assessment: No/denies pain    Home Living                      Prior Function            PT  Goals (current goals can now be found in the care plan section)      Frequency  7X/week    PT Plan Current plan remains appropriate    Co-evaluation             End of Session Equipment Utilized During Treatment: Gait belt Activity Tolerance: Patient tolerated treatment well;Patient limited by fatigue Patient left: in bed;with call bell/phone within reach;with bed alarm set;with nursing/sitter in room;with family/visitor present     Time: [id number] PT Time Calculation (min) (ACUTE ONLY): 35 min  Charges:  $Therapeutic Activity: 23-37 mins                    G Codes:      Charlaine Dalton 02/26/2015, 3:52 PM

## 2015-02-26 NOTE — Progress Notes (Signed)
Nutrition Follow-up     INTERVENTION:  Meals and snacks: cater to pt preferences Medical Nutrition Supplement Therapy: Continue mightyshake and magic cup for added nutrition   NUTRITION DIAGNOSIS:   Inadequate oral intake related to inability to eat as evidenced by NPO status, improving    GOAL:   Patient will meet greater than or equal to 90% of their needs  Not meeting nutritional needs  MONITOR:    (Energy Intake, Digestive System, Electrolyte and Renal Profile, Anthropometrics)  REASON FOR ASSESSMENT:   Malnutrition Screening Tool    ASSESSMENT:     Palliative care following and noted wife has refused PEG tube for pt at this time.    Current Nutrition: during admission extremely poor po intake, ate 2 pancakes this am per wife and drank juice today.  Ate ice cream yesterday per wife   Gastrointestinal Profile: Last BM: 11/29   Scheduled Medications:  . amantadine  100 mg Oral BID  . aspirin  81 mg Oral Daily  . atorvastatin  80 mg Oral q1800  . enoxaparin (LOVENOX) injection  40 mg Subcutaneous Q24H  . LORazepam  1 mg Intravenous Once  . metoprolol tartrate  25 mg Oral BID  . risperiDONE  1 mg Oral QHS  . traZODone  50 mg Oral QHS    Continuous Medications:  . dextrose 5 % and 0.45% NaCl 100 mL/hr at 02/25/15 2052     Electrolyte/Renal Profile and Glucose Profile:   Recent Labs Lab 02/22/15 0513 02/25/15 0515 02/26/15 0529  NA 142 139 138  K 4.8 3.9 4.2  CL 116* 115* 112*  CO2 19* 19* 21*  BUN 32* 20 21*  CREATININE 1.64* 1.57* 1.47*  CALCIUM 8.9 8.2* 8.1*  GLUCOSE 84 109* 115*   Protein Profile:  Recent Labs Lab 02/20/15 1804  ALBUMIN 3.0*       Weight Trend since Admission: Filed Weights   02/20/15 1804 02/20/15 2203  Weight: 170 lb 3.2 oz (77.202 kg) 170 lb 14.4 oz (77.52 kg)      Diet Order:  Diet - low sodium heart healthy DIET DYS 2 Room service appropriate?: Yes with Assist; Fluid consistency:: Nectar  Thick  Skin:  Reviewed, no issues   Height:   Ht Readings from Last 1 Encounters:  02/20/15 5\' 10"  (1.778 m)    Weight:   Wt Readings from Last 1 Encounters:  02/20/15 170 lb 14.4 oz (77.52 kg)    Ideal Body Weight:     BMI:  Body mass index is 24.52 kg/(m^2).  Estimated Nutritional Needs:   Kcal:  BEE: 1456kcals, TEE: (IF 1.1-1.3)(AF 1.2) 1922-2272kcals  Protein:  62-78g protein (0.8-1.0g/kg)  Fluid:  1938-2367mL of fluid (25-38mL/kg)   EDUCATION NEEDS:   Education needs no appropriate at this time  [place]. Zenia Resides, [place], [place] (pager)

## 2015-02-26 NOTE — Plan of Care (Addendum)
No sign of pain. VSS, afebrile. Sitter remains at bedside for safety. Will continue to assess.   Problem: Safety: Goal: Ability to remain free from injury will improve Outcome: Progressing High fall risk. Frequently tries to get out of bed. Safety sitter at bedside to prevent falls. Safe environment provided.   Problem: Physical Regulation: Goal: Ability to maintain clinical measurements within normal limits will improve Outcome: Progressing Patient awake & alert during assessment.  Left arm and leg are strong.  Grip and flexion present.  Right arm is flaccid and right leg has minimal resistance against gravity. Expressive aphasia.   Problem: Nutrition: Goal: Adequate nutrition will be maintained Outcome: Progressing DYS 2 diet. Feeder but poor appetite. Dextrose 5%-.45% NS infusing at 100 mL/hr.  Problem: Self-Care: Goal: Ability to participate in self-care as condition permits will improve Total care pt. Incontinent through the night requiring assistance for changing & peri care. Plan to d/c SNF when bed available  Problem: Nutrition: Goal: Dietary intake will improve Outcome: Progressing Poor PO intake. Palliative consult for possible peg tube placement/hospice  Problem: Tissue Perfusion: Goal: Cerebral tissue perfusion will improve Outcome: Completed/Met Date Met:  02/26/15 No changes in neurological status.

## 2015-02-26 NOTE — Progress Notes (Signed)
PT Cancellation Note  Patient Details Name: Tyler Hale MRN: [id number] DOB: 1927/01/07   Cancelled Treatment:    Reason Eval/Treat Not Completed: Patient's level of consciousness. Treatment attempted earlier today, as pt did not discharge yesterday; pt continues to have a discharge order in place, but does not have a bed available at this time in skilled nursing facility. Sitter and family in room and note pt has been unable to awaken since yesterday. Family does not wish PT attempted at this time. Considering palliative or hospice at this time.   Erline Levine Bishop 02/26/2015, 10:45 AM

## 2015-02-26 NOTE — Progress Notes (Signed)
Palliative Care progress note: Patient admitted with CVA and palliative care requested for code status and goals of care discussion. Patient is alert however unable to actively participate in conversation due to side effects from stroke. Patient's wife present in the room and states that he is eating and drinking thickened liquids when his family feeds him. Last night patient had 3-4 cups of ice cream, wife reports patient eating all (2) of his pancakes a couple days ago and is now drinking multiple containers of juice and water. Detailed discussion regarding PEG tube placement with wife who states that she does not think patient would keep PEG in place nor would he desire PEG placement for life sustaining purposes. Discussed code status at length to which wife reports she feels strongly that patient would still want to have CPR because he "has a strong heart". Although unsure of how much patient understands/comprehends, wife asked patient about code status and patient replied "yes" to having CPR. Will remain full code at this time. Wife still desires for patient to be discharged to SNF for rehab and eventual LTC. Will fax information for PC to follow once bed offer is made and accepted. Wife in agreement with this plan. Discussed with care team and all members updated.  Atha Starks, MSW, LCSW Palliative Care social worker [phone] (c)

## 2015-02-26 NOTE — Plan of Care (Signed)
Problem: Safety: Goal: Ability to remain free from injury will improve Outcome: Progressing Pt free from injury this shift; sitter at bedside  Problem: Physical Regulation: Goal: Ability to maintain clinical measurements within normal limits will improve Outcome: Progressing VSS  Problem: Nutrition: Goal: Adequate nutrition will be maintained Outcome: Not Progressing Poor po intake  Problem: Self-Care: Goal: Ability to participate in self-care as condition permits will improve Outcome: Not Progressing Total care  Problem: Nutrition: Goal: Dietary intake will improve Outcome: Not Progressing Poor po intake

## 2015-02-27 DIAGNOSIS — N183 Chronic kidney disease, stage 3 (moderate): Secondary | ICD-10-CM

## 2015-02-27 DIAGNOSIS — F0150 Vascular dementia without behavioral disturbance: Secondary | ICD-10-CM

## 2015-02-27 DIAGNOSIS — C61 Malignant neoplasm of prostate: Secondary | ICD-10-CM

## 2015-02-27 DIAGNOSIS — N179 Acute kidney failure, unspecified: Secondary | ICD-10-CM

## 2015-02-27 DIAGNOSIS — I69320 Aphasia following cerebral infarction: Secondary | ICD-10-CM

## 2015-02-27 DIAGNOSIS — E785 Hyperlipidemia, unspecified: Secondary | ICD-10-CM

## 2015-02-27 DIAGNOSIS — R531 Weakness: Secondary | ICD-10-CM

## 2015-02-27 DIAGNOSIS — I639 Cerebral infarction, unspecified: Secondary | ICD-10-CM | POA: Insufficient documentation

## 2015-02-27 DIAGNOSIS — I129 Hypertensive chronic kidney disease with stage 1 through stage 4 chronic kidney disease, or unspecified chronic kidney disease: Secondary | ICD-10-CM

## 2015-02-27 DIAGNOSIS — I69392 Facial weakness following cerebral infarction: Secondary | ICD-10-CM

## 2015-02-27 MED ORDER — METOPROLOL TARTRATE 25 MG PO TABS: 25.0000 mg | ORAL_TABLET | Freq: Two times a day (BID) | ORAL | Status: AC

## 2015-02-27 MED ORDER — RISPERIDONE 1 MG PO TABS: 1.0000 mg | ORAL_TABLET | Freq: Every day | ORAL | Status: AC

## 2015-02-27 MED ORDER — TRAZODONE HCL 50 MG PO TABS: 50.0000 mg | ORAL_TABLET | Freq: Every evening | ORAL | Status: AC | PRN

## 2015-02-27 NOTE — Clinical Social Work Note (Signed)
Pt's sitter needs to be dc'ed for 24 hours prior to discharge. Pt will discharge to [facility] after 11 AM on 12/2/216. MD and RN are aware. Pt's wife is in agreement. CSW will continue to follow.   Darden Dates, MSW, LCSW Clinical Social Worker [phone]

## 2015-02-27 NOTE — Discharge Instructions (Signed)
For diet-  Recommendations  Diet recommendations: Dysphagia 2 (fine chop);Nectar-thick liquid Liquids provided via: Cup;Teaspoon;No straw Medication Administration: Whole meds with puree Supervision: Staff to assist with self feeding Compensations: Minimize environmental distractions;Slow rate;Small sips/bites Postural Changes and/or Swallow Maneuvers: Seated upright 90 degrees              Oral Care Recommendations: Oral care BID;Staff/trained caregiver to provide oral care Follow up Recommendations: Skilled Nursing facility Plan: Continue with current plan of care       Pt would benefit from a full cognitive linguistic assessment and treatment to target deficits noted as well as any new deficits discovered in full assessment. NSG updated.

## 2015-02-27 NOTE — Progress Notes (Signed)
Physical Therapy Treatment Patient Details Name: Tyler Hale MRN: [id number] DOB: 11/02/1926 Today's Date: 02/27/2015    History of Present Illness Pt is here with R sided weakness, found to have acute on chronic CVA with R sided weakness with expressive and receptive aphasia    PT Comments    Pt agreeable to PT and shows less need for assist with bed mobility. In seated edge of bed position pt slightly agitated attempting to move left lower extremity around rolling walker. Once rolling walker in place pt requires some encouragement and increased gesturing to stand. Pt stands only a brief period with impulsive sit; stands a second time and again becomes agitated pushing rolling walker away with left hand becoming unsteady. Pt impulsively sits again and returns to supine position. No further therapy attempted. Continue PT as tolerated for progression of strength and all functional mobility.   Follow Up Recommendations  SNF     Equipment Recommendations       Recommendations for Other Services       Precautions / Restrictions Restrictions Weight Bearing Restrictions: No    Mobility  Bed Mobility Overal bed mobility: Needs Assistance Bed Mobility: Supine to Sit;Sit to Supine     Supine to sit: Min guard Sit to supine: Min assist   General bed mobility comments: Perfoms mostly without assist and reposition upward in bed independently today  Transfers Overall transfer level: Needs assistance Equipment used: Rolling walker (2 wheeled) Transfers: Sit to/from Stand Sit to Stand: Min assist;+2 safety/equipment         General transfer comment: STS performed 2x; pt becomes mildly agitated after second stand pushing rw away, sitting impulsively and returns to supine  Ambulation/Gait             General Gait Details: unable   Stairs            Wheelchair Mobility    Modified Rankin (Stroke Patients Only)       Balance           Standing balance  support: Single extremity supported Standing balance-Leahy Scale: Poor                      Cognition Arousal/Alertness: Awake/alert Behavior During Therapy: Agitated Overall Cognitive Status: Impaired/Different from baseline                      Exercises Other Exercises Other Exercises: 2 brief stands under 25 seconds    General Comments        Pertinent Vitals/Pain Pain Assessment:  (no response)    Home Living                      Prior Function            PT Goals (current goals can now be found in the care plan section)      Frequency  7X/week    PT Plan Current plan remains appropriate    Co-evaluation             End of Session Equipment Utilized During Treatment: Gait belt Activity Tolerance: Treatment limited secondary to agitation Patient left: in bed;with call bell/phone within reach;with chair alarm set;with family/visitor present     Time: [id number] PT Time Calculation (min) (ACUTE ONLY): 11 min  Charges:  $Therapeutic Activity: 8-22 mins                    G  Codes:      Charlaine Dalton 02/27/2015, 4:05 PM

## 2015-02-27 NOTE — Plan of Care (Signed)
Problem: Nutrition: Goal: Dietary intake will improve Outcome: Progressing Sitter at bedside. Agitated at times. Continue to monitor

## 2015-02-27 NOTE — Progress Notes (Signed)
[facility] [facility] [place] NAME: Tyler Hale    MR#:  [id number]  DATE OF BIRTH:  [birth year]  SUBJECTIVE: :  Appears more alert today, but not able to carry out communication speech Able to eat some food.   Full assistance in feeding for that.   Review of Systems  Unable to perform ROS: acuity of condition   patient is noncommunicative, speaks a few words, but does not appear to be understanding.  VITAL SIGNS: Blood pressure 164/59, pulse 91, temperature 98.1 F (36.7 C), temperature source Oral, resp. rate 20, height 5\' 10"  (1.778 m), weight 77.52 kg (170 lb 14.4 oz), SpO2 98 %.  PHYSICAL EXAMINATION:   GENERAL:  81 y.o.-year-old patient lying in the bed alert, not in any distress, sitter at bedside.  EYES:sluggish pupils are reactive 3 mm   HEENT: Head atraumatic, normocephalic. Oropharynx and nasopharynx dry NECK:  Supple, no jugular venous distention. No thyroid enlargement, no tenderness.  LUNGS: Normal breath sounds bilaterally, no wheezing, rales,rhonchi or crepitation. No use of accessory muscles of respiration.  CARDIOVASCULAR: irregular, regular . No murmurs, rubs, or gallops.  ABDOMEN: Soft, nontender, nondistended. Bowel sounds present. No organomegaly or mass.  EXTREMITIES: No pedal edema, cyanosis, or clubbing.  NEUROLOGIC: Left-sided upper and lower extremity power is 5 out of 5, right upper extremity power 0 out of 5, right lower extremity power 3 out of 5. PSYCHIATRIC: Patient is alert but not very communicative due to speech issues and does not appear to be understanding my questions. SKIN: No obvious rash, lesion, or ulcer.   ORDERS/RESULTS REVIEWED:   CBC  Recent Labs Lab 02/20/15 1804 02/21/15 0636 02/26/15 0529  WBC 9.9 9.6 10.1  HGB 12.2* 11.3* 12.1*  HCT 37.0* 34.1* 37.3*  PLT 287 264 269  MCV 85.1 84.4 84.4  MCH 28.1 27.9 27.4  MCHC 33.1 33.1 32.4  RDW 14.9* 14.4 14.6*  LYMPHSABS 2.2  --   --    MONOABS 0.8  --   --   EOSABS 0.6  --   --   BASOSABS 0.1  --   --    ------------------------------------------------------------------------------------------------------------------  Chemistries   Recent Labs Lab 02/20/15 1804 02/21/15 0636 02/22/15 0513 02/25/15 0515 02/26/15 0529  NA 140 142 142 139 138  K 4.6 4.8 4.8 3.9 4.2  CL 113* 116* 116* 115* 112*  CO2 20* 21* 19* 19* 21*  GLUCOSE 107* 98 84 109* 115*  BUN 41* 36* 32* 20 21*  CREATININE 1.94* 1.75* 1.64* 1.57* 1.47*  CALCIUM 8.6* 8.5* 8.9 8.2* 8.1*  AST 78*  --   --   --   --   ALT 81*  --   --   --   --   ALKPHOS 127*  --   --   --   --   BILITOT 0.4  --   --   --   --    ------------------------------------------------------------------------------------------------------------------ estimated creatinine clearance is 37.2 mL/min (by C-G formula based on Cr of 1.47). ------------------------------------------------------------------------------------------------------------------ No results for input(s): TSH, T4TOTAL, T3FREE, THYROIDAB in the last 72 hours.  Invalid input(s): FREET3  Cardiac Enzymes No results for input(s): CKMB, TROPONINI, MYOGLOBIN in the last 168 hours.  Invalid input(s): CK ------------------------------------------------------------------------------------------------------------------ Invalid input(s): POCBNP ---------------------------------------------------------------------------------------------------------------  RADIOLOGY: No results found.  ASSESSMENT AND PLAN:  Principal Problem:   Stroke (cerebrum) (HCC) Active Problems:   CVA (cerebral infarction)   Dysphasia   Arrhythmia   Carotid stenosis  History of stroke   Acute cerebrovascular accident (CVA) ([place]) 1. Acute stroke in the left MCA distribution with right-sided weakness:    Patient had previous echocardiogram and carotid Dopplers at [facility] for the stroke. Patient is not doing well from his stroke.   His  agitation is now much better with the medications.   He is completely alert today, but remains not very well oriented.  He has difficulty swallowing and suggested to have dysphagia 2 diet which he is able to tolerate but because of her large stroke he have some cognitive impairment and he is not understanding to eat for, so he needs full assistance and supervision with the food.  We had discussion about this with patient's wife she refused for PEG tube.  I called and spoke to patient's son also on phone, and informed him about this decision and possible outcomes off developing malnutrition or aspirations and which might end up having his repeated admissions over the family may help to decide about hospice care. He understands this and he will discuss with other family members.  Meanwhile today patient has a bed available at [facility], but this patient is maintained with a sittter- they would like to watch 24 hrs without sitter before discharge.  2. Essential hypertension, stable.   Continue metoprolol   3. Acute on chronic renal failure, stable. Continue IV fluid administration as patient's oral intake is poor- renal func improved. 4. Acidosis on BMP , . Compensated on  VBG 5. Anemia in chronic disease, 6. Dysphagia: Palliative care consult to help, family denied PEG tube. 7. Agitation, likely delirium , now on amantadine with when necessary Haldol and Ativan, pt is very drowsy with that.      Stable now- after 3-4 days of drowsiness and agitation episodes. 9. Hyperlipidemia with LDL 104 goal below 100, preferably below 70 , continue Lipitor     Though we are not able to give many oral meds.    DRUG ALLERGIES:  Allergies  Allergen Reactions  . Prednisone Other (See Comments)    "blisters"  . Vioxx [Rofecoxib] Other (See Comments)    "bleeding of stomach"    CODE STATUS:     Code Status Orders        Start     Ordered   02/20/15 2231  Full code   Continuous     02/20/15  2230      TOTAL TIME TAKING CARE OF THIS PATIENT:  35 minutes.   Spoke to patient's son on the phone and social worker and case manager  Vaughan Basta M.D on 02/27/2015 at 3:36 PM  Between 7am to 6pm - Pager - [phone]  After 6pm go to [email or web address] - password [email or web address]  Office  [phone]  CC: Primary care physician; Albina Billet, MD
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
## 2015-02-27 NOTE — Progress Notes (Signed)
Dr. Lavetta Nielsen notified new assessment in the left pupil. The pupil is non-reactive in a U shape, oval Continue to monitor.

## 2015-02-27 NOTE — Plan of Care (Signed)
Problem: Safety: Goal: Ability to remain free from injury will improve Outcome: Progressing High fall risk.  Discontinued Barrister's clerk.  Safe environment provided.  Remained free on injury this shift.     Problem: Physical Regulation: Goal: Ability to maintain clinical measurements within normal limits will improve Outcome: Progressing Awake & Alert. Left arm and leg are strong.  Grip and flexion present.  Right arm flaccid. Right leg minimal resistance against gravity. Expressive aphasia.   Problem: Nutrition: Goal: Adequate nutrition will be maintained Outcome: Progressing Dysphagia II Diet. Feeder. Poor appetite.  Dextrose 5%-.45% NS infusing at 100 mL/hr.  Problem: Self-Care: Goal: Ability to participate in self-care as condition permits will improve Outcome: Progressing Unable to Assist with ADL's. Incontinent. Plan to discharge to [facility].  Problem: Nutrition: Goal: Dietary intake will improve Outcome: Progressing Encouraged. Remains poor PO intake.

## 2015-02-28 MED ORDER — HALOPERIDOL LACTATE 5 MG/ML IJ SOLN
5.0000 mg | Freq: Once | INTRAMUSCULAR | Status: AC
  Administered 2015-02-28: 5 mg via INTRAVENOUS
  Filled 2015-02-28: qty 1

## 2015-02-28 NOTE — Clinical Social Work Placement (Signed)
   CLINICAL SOCIAL WORK PLACEMENT  NOTE  Date:  02/28/2015  Patient Details  Name: Tyler Hale MRN: [id number] Date of Birth: February 18, 1927  Clinical Social Work is seeking post-discharge placement for this patient at the [place] level of care (*CSW will initial, date and re-position this form in  chart as items are completed):  Yes   Patient/family provided with [facility] Work Department's list of facilities offering this level of care within the geographic area requested by the patient (or if unable, by the patient's family).  Yes   Patient/family informed of their freedom to choose among providers that offer the needed level of care, that participate in Medicare, Medicaid or managed care program needed by the patient, have an available bed and are willing to accept the patient.  Yes   Patient/family informed of [place]'s ownership interest in [facility] and [facility], as well as of the fact that they are under no obligation to receive care at these facilities.  PASRR submitted to EDS on       PASRR number received on       Existing PASRR number confirmed on 02/21/15     FL2 transmitted to all facilities in geographic area requested by pt/family on 02/21/15     FL2 transmitted to all facilities within larger geographic area on       Patient informed that his/her managed care company has contracts with or will negotiate with certain facilities, including the following:        Yes   Patient/family informed of bed offers received.  Patient chooses bed at [facility]     Physician recommends and patient chooses bed at  [facility])    Patient to be transferred to [facility] on 02/28/15.  Patient to be transferred to facility by [facility]     Patient family notified on 02/28/15 of transfer.  Name of family member notified:  wife     PHYSICIAN       Additional Comment:     _______________________________________________ Darden Dates, LCSW 02/28/2015, 10:44 AM

## 2015-02-28 NOTE — Plan of Care (Signed)
Problem: Safety: Goal: Ability to remain free from injury will improve Outcome: Progressing Pt is disoriented x4, unable to express needs. Impulsive, wants to get out of bed at times. Friend at bedside.  Problem: Physical Regulation: Goal: Ability to maintain clinical measurements within normal limits will improve Outcome: Progressing Pt continues to have generalized weakness. PO intake encouraged.  Problem: Nutrition: Goal: Adequate nutrition will be maintained Outcome: Progressing Pt continues to have a poor appetite. Continues to receive D5 with 1/2 NS.  Problem: Self-Care: Goal: Ability to participate in self-care as condition permits will improve Outcome: Progressing Pt remains incontinent, hourly rounding.

## 2015-02-28 NOTE — Clinical Social Work Note (Signed)
Pt is ready for discharge to [facility]. Facility has received information and is ready to admit pt. CSW notified pt's wife, who is in agreement with discharge plan. RN will call report and EMS will provide transportation. CSW is signing off as no further needs identified.   Darden Dates, MSW, LCSW Clinical Social Worker  [phone]

## 2015-02-28 NOTE — Care Management Important Message (Signed)
Important Message  Patient Details  Name: Tyler Hale MRN: [id number] Date of Birth: [birth year]   Medicare Important Message Given:  Yes    Shelbie Ammons, RN 02/28/2015, 10:16 AM

## 2015-02-28 NOTE — Progress Notes (Signed)
MD making rounds. Discharge orders received. Clinical Education officer, museum (CSW) facilitating discharge to [facility]. Discharge packet being prepared. Telemetry Removed. IV removed. Vital Signs Stable. No signs and/or symptoms of distress noted. Denies pain. Denies needs. Report called to Abby, Therapist, sports at [facility]. No unanswered questions. EMS called for transport. EMS on Unit for transport. Discharge Packet given to EMS transporter. Belongings sent with family.

## 2015-03-23 ENCOUNTER — Emergency Department
Admission: EM | Admit: 2015-03-23 | Discharge: 2015-03-23 | Disposition: A | Discharge status: Home / Self Care | Payer: Medicare Other | Attending: Emergency Medicine | Admitting: Emergency Medicine

## 2015-03-23 ENCOUNTER — Emergency Department: Payer: Medicare Other

## 2015-03-23 DIAGNOSIS — Z7982 Long term (current) use of aspirin: Secondary | ICD-10-CM | POA: Insufficient documentation

## 2015-03-23 DIAGNOSIS — N3000 Acute cystitis without hematuria: Secondary | ICD-10-CM | POA: Diagnosis not present

## 2015-03-23 DIAGNOSIS — Z792 Long term (current) use of antibiotics: Secondary | ICD-10-CM | POA: Diagnosis not present

## 2015-03-23 DIAGNOSIS — E860 Dehydration: Secondary | ICD-10-CM | POA: Insufficient documentation

## 2015-03-23 DIAGNOSIS — N289 Disorder of kidney and ureter, unspecified: Secondary | ICD-10-CM | POA: Diagnosis not present

## 2015-03-23 DIAGNOSIS — I639 Cerebral infarction, unspecified: Secondary | ICD-10-CM | POA: Diagnosis not present

## 2015-03-23 DIAGNOSIS — Z79899 Other long term (current) drug therapy: Secondary | ICD-10-CM | POA: Insufficient documentation

## 2015-03-23 DIAGNOSIS — R531 Weakness: Secondary | ICD-10-CM | POA: Diagnosis present

## 2015-03-23 DIAGNOSIS — G839 Paralytic syndrome, unspecified: Secondary | ICD-10-CM | POA: Insufficient documentation

## 2015-03-23 LAB — URINALYSIS COMPLETE WITH MICROSCOPIC (ARMC ONLY)
Bilirubin Urine: NEGATIVE
Glucose, UA: NEGATIVE mg/dL
Hgb urine dipstick: NEGATIVE
Ketones, ur: NEGATIVE mg/dL
Nitrite: NEGATIVE
PH: 6 (ref 5.0–8.0)
PROTEIN: 30 mg/dL — AB
Specific Gravity, Urine: 1.015 (ref 1.005–1.030)

## 2015-03-23 LAB — CBC WITH DIFFERENTIAL/PLATELET
BASOS ABS: 0.1 10*3/uL (ref 0–0.1)
Basophils Relative: 1 %
EOS ABS: 0.8 10*3/uL — AB (ref 0–0.7)
EOS PCT: 9 %
HCT: 37 % — ABNORMAL LOW (ref 40.0–52.0)
Hemoglobin: 11.9 g/dL — ABNORMAL LOW (ref 13.0–18.0)
LYMPHS PCT: 27 %
Lymphs Abs: 2.5 10*3/uL (ref 1.0–3.6)
MCH: 27.6 pg (ref 26.0–34.0)
MCHC: 32.2 g/dL (ref 32.0–36.0)
MCV: 85.7 fL (ref 80.0–100.0)
Monocytes Absolute: 0.8 10*3/uL (ref 0.2–1.0)
Monocytes Relative: 9 %
Neutro Abs: 4.9 10*3/uL (ref 1.4–6.5)
Neutrophils Relative %: 54 %
PLATELETS: 196 10*3/uL (ref 150–440)
RBC: 4.32 MIL/uL — AB (ref 4.40–5.90)
RDW: 15.2 % — ABNORMAL HIGH (ref 11.5–14.5)
WBC: 9 10*3/uL (ref 3.8–10.6)

## 2015-03-23 LAB — COMPREHENSIVE METABOLIC PANEL
ALBUMIN: 3.4 g/dL — AB (ref 3.5–5.0)
ALT: 27 U/L (ref 17–63)
AST: 27 U/L (ref 15–41)
Alkaline Phosphatase: 113 U/L (ref 38–126)
Anion gap: 7 (ref 5–15)
BUN: 47 mg/dL — AB (ref 6–20)
CHLORIDE: 116 mmol/L — AB (ref 101–111)
CO2: 23 mmol/L (ref 22–32)
CREATININE: 2.73 mg/dL — AB (ref 0.61–1.24)
Calcium: 8.8 mg/dL — ABNORMAL LOW (ref 8.9–10.3)
GFR calc Af Amer: 23 mL/min — ABNORMAL LOW (ref >60)
GFR calc non Af Amer: 20 mL/min — ABNORMAL LOW (ref >60)
GLUCOSE: 108 mg/dL — AB (ref 65–99)
Potassium: 5.4 mmol/L — ABNORMAL HIGH (ref 3.5–5.1)
SODIUM: 146 mmol/L — AB (ref 135–145)
Total Bilirubin: 0.6 mg/dL (ref 0.3–1.2)
Total Protein: 7 g/dL (ref 6.5–8.1)

## 2015-03-23 LAB — PROTIME-INR
INR: 1.01
Prothrombin Time: 13.5 seconds (ref 11.4–15.0)

## 2015-03-23 MED ORDER — SODIUM CHLORIDE 0.9 % IV BOLUS (SEPSIS)
1000.0000 mL | Freq: Once | INTRAVENOUS | Status: AC
  Administered 2015-03-23: 1000 mL via INTRAVENOUS

## 2015-03-23 MED ORDER — DEXTROSE 5 % IV SOLN
1.0000 g | Freq: Once | INTRAVENOUS | Status: AC
  Administered 2015-03-23: 1 g via INTRAVENOUS
  Filled 2015-03-23: qty 10

## 2015-03-23 NOTE — ED Notes (Signed)
Patient transported to CT 
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
## 2015-03-23 NOTE — Discharge Instructions (Signed)
BUN was 47, creatinine 2.7. There is a urinary tract infection present. Mr. Tyler Hale was treated with ceftriaxone, 1 g, IV.  Mr. [name] situation was discussed with Dr. Clemmie Krill. She reports that IV fluids will be continued at [facility] as well as ceftriaxone for the urinary tract infection.  Return to the emergency department if there are any further urgent concerns.  Dehydration Dehydration means your body does not have as much fluid as it needs. Your kidneys, brain, and heart will not work properly without the right amount of fluids and salt. Older adults are more likely to become dehydrated than younger adults. This is because:   Their bodies do not hold water as well.  Their bodies do not respond to temperature changes as well.  They do not get thirsty as easily or as quickly. HOME CARE  Ask your doctor how to replace body fluid losses (rehydrate).  Drink enough fluids to keep your pee (urine) clear or pale yellow.  Drink small amounts of fluids often if you feel sick to your stomach (nauseous) or throw up (vomit).  Eat like you normally do.  Avoid:  Foods or drinks high in sugar.  Bubbly (carbonated) drinks.  Juice.  Very hot or cold fluids.  Drinks with caffeine.  Fatty, greasy foods.  Alcohol.  Tobacco.  Eating too much.  Gelatin desserts.  Wash your hands to avoid spreading germs (bacteria, viruses).  Only take medicine as told by your doctor.  Keep all doctor visits as told. GET HELP IF:  You have belly (abdominal) pain that gets worse or stays in one spot (localizes).  You have a rash, stiff neck, or bad headache.  You get easily annoyed, sleepy, or are hard to wake up.  You feel weak, dizzy, or very thirsty.  You have a fever. GET HELP RIGHT AWAY IF:   You cannot drink fluid without throwing up.  You get worse even with treatment.  You throw up often.  You have watery poop (diarrhea) often.  Your vomit has blood in it or looks  greenish.  Your poop (stool) has blood in it or looks black and tarry.  You have not peed in 6 to 8 hours or have only peed a small amount of very dark pee.  You pass out (faint). MAKE SURE YOU:   Understand these instructions.  Will watch your condition.  Will get help right away if you are not doing well or get worse.   This information is not intended to replace advice given to you by your health care provider. Make sure you discuss any questions you have with your health care provider.   Document Released: 03/04/2011 Document Revised: 03/20/2013 Document Reviewed: 11/20/2012 Elsevier Interactive Patient Education [employer].

## 2015-03-23 NOTE — ED Notes (Signed)
Patient became weaker today after lunch. Per wife, patient held left side of head and left side of face was drawn up. Patient could not stand up to transfer to bed without assistance.

## 2015-03-25 LAB — URINE CULTURE: Culture: NO GROWTH

## 2015-05-28 DEATH — deceased

## 2016-02-15 IMAGING — CT CT HEAD W/O CM
1 series · 16 of 30 positions shown, 20 images · non-contrast
Comparison: Head CT scan 02/20/2015

CLINICAL DATA: New onset left-sided weakness.  Initial encounter.

EXAM:
CT HEAD WITHOUT CONTRAST
TECHNIQUE: Contiguous axial images were obtained from the base of the skull
through the vertex without intravenous contrast.

[Series 2: head wo · axial · 0.48mm/px · z∈[-164,-38]mm · 16 of 32 slices shown, 20 images]
[im 2/32  brain]
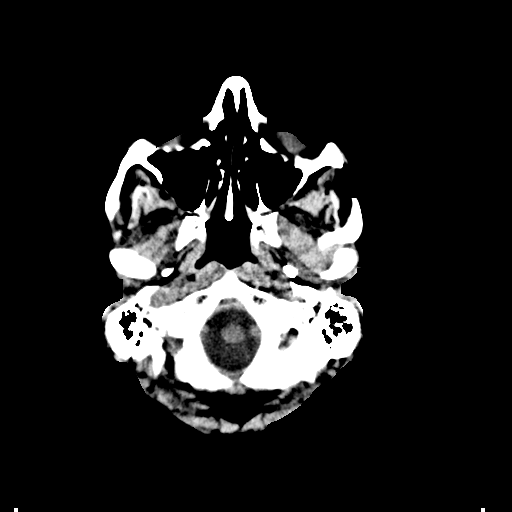
[im 2/32  bone]
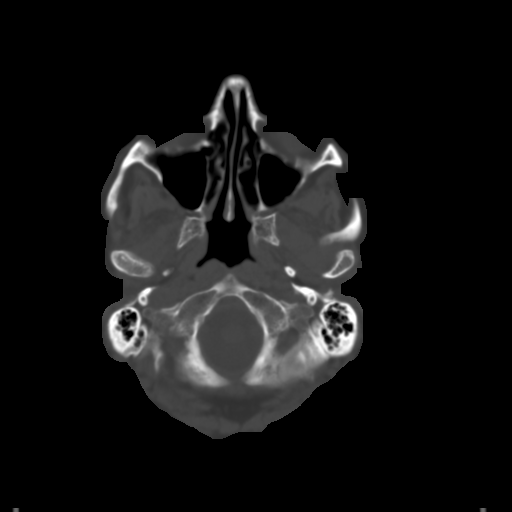
[im 4/32  brain]
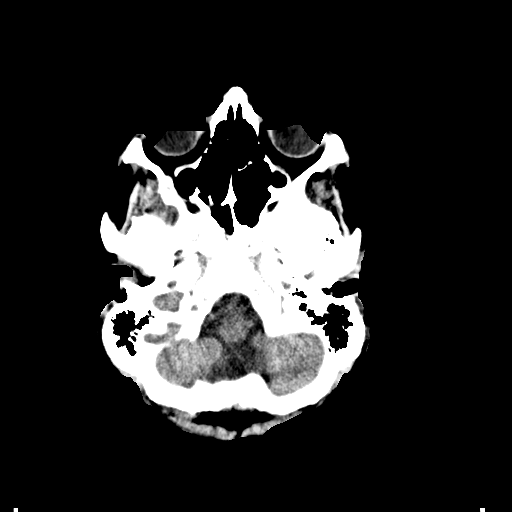
[im 6/32  brain]
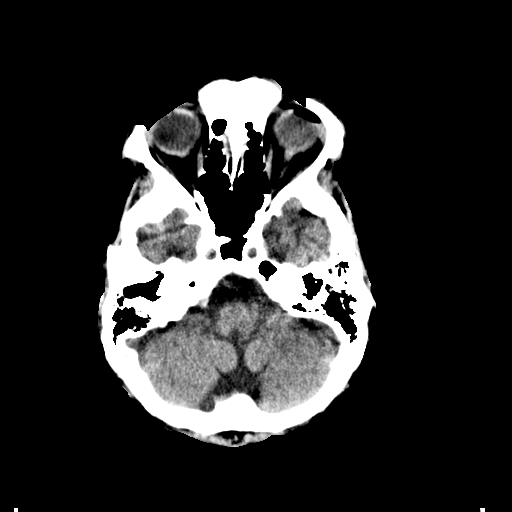
[im 8/32  brain]
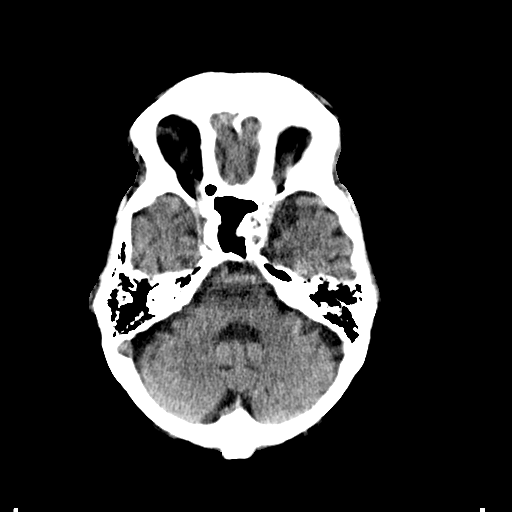
[im 9/32  brain]
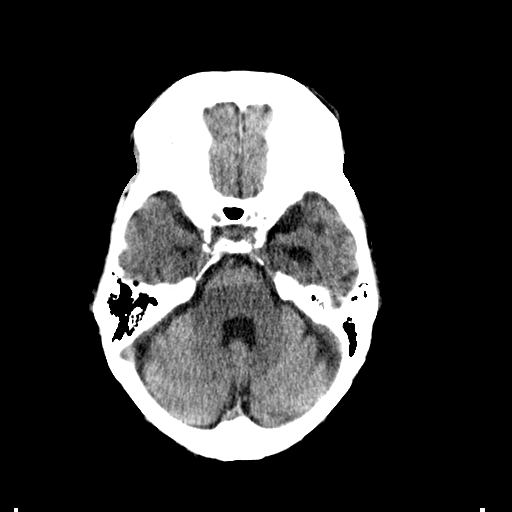
[im 9/32  bone]
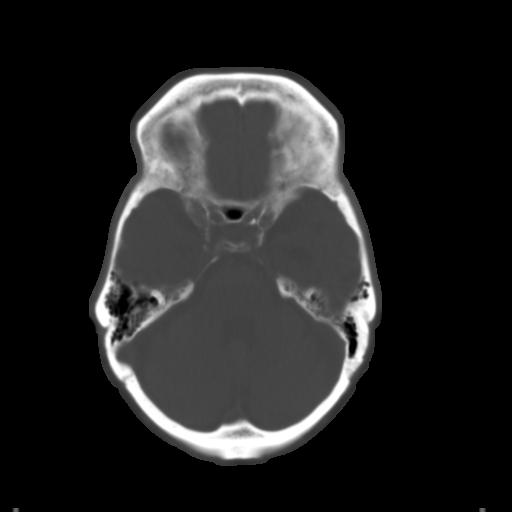
[im 11/32  brain]
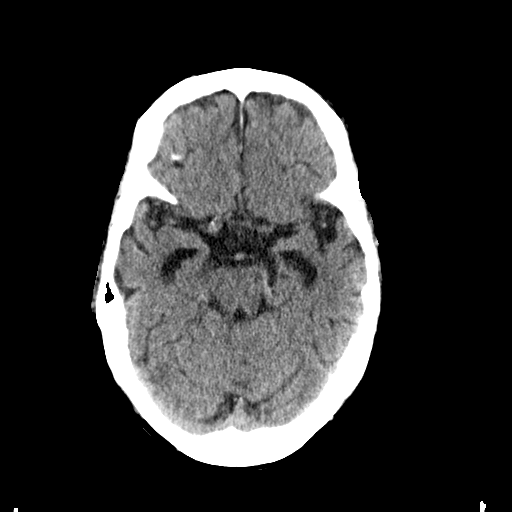
[im 13/32  brain]
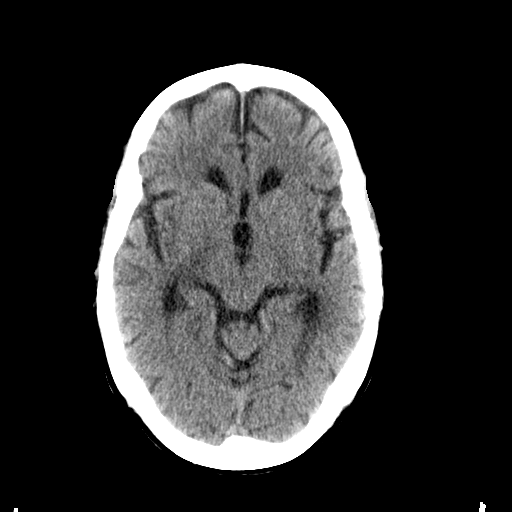
[im 15/32  brain]
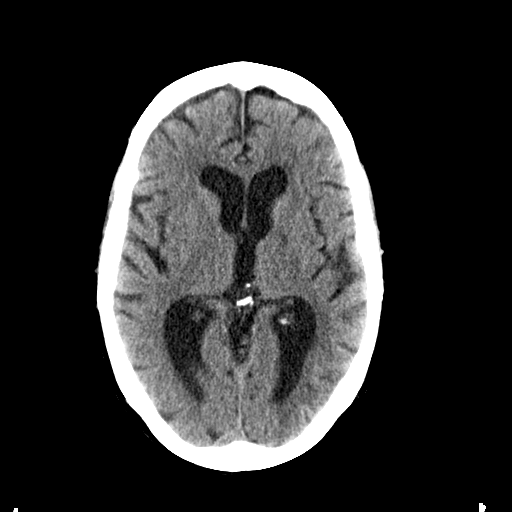
[im 17/32  brain]
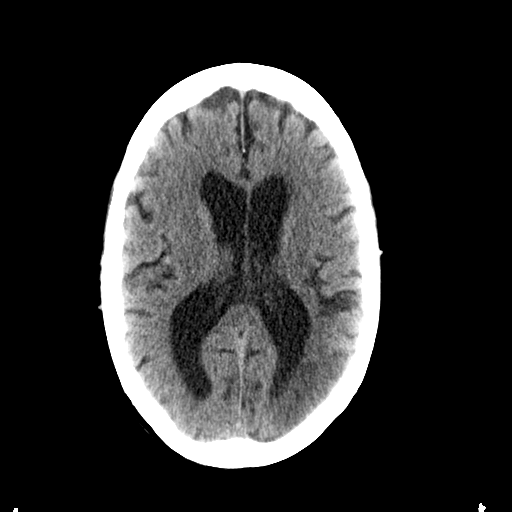
[im 17/32  bone]
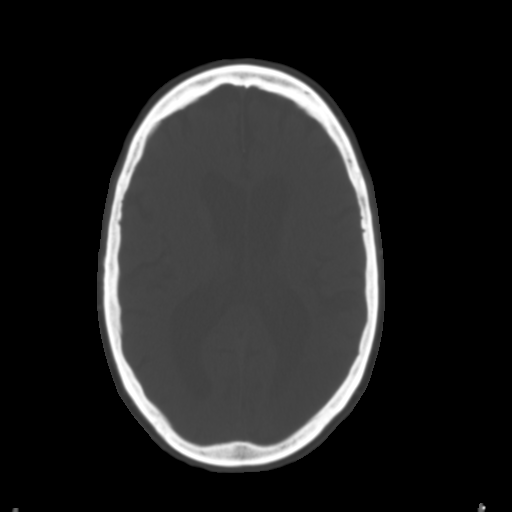
[im 19/32  brain]
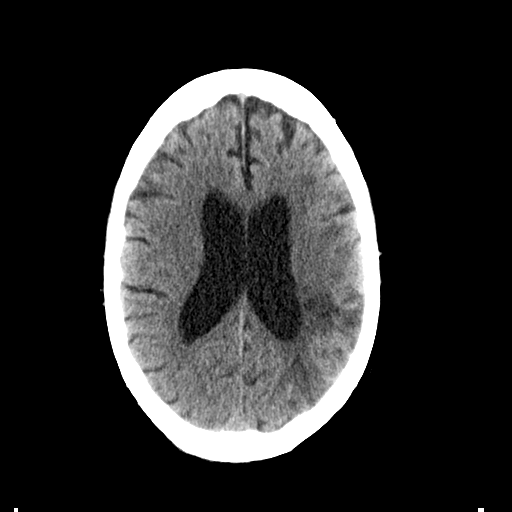
[im 21/32  brain]
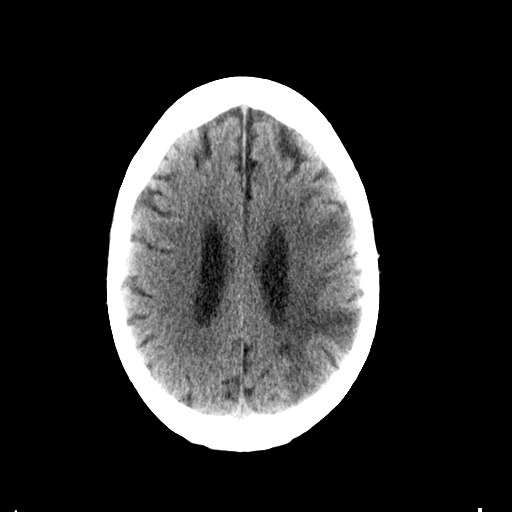
[im 23/32  brain]
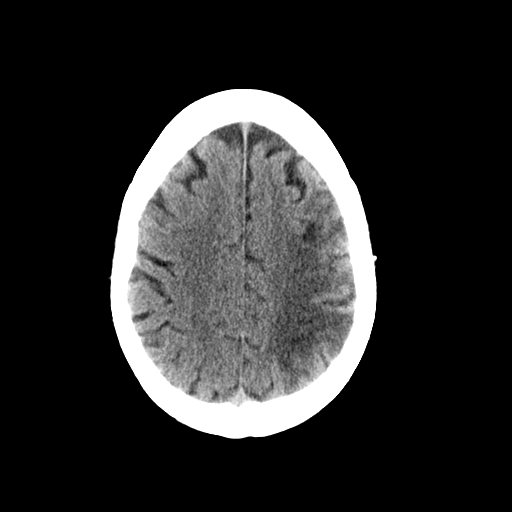
[im 24/32  brain]
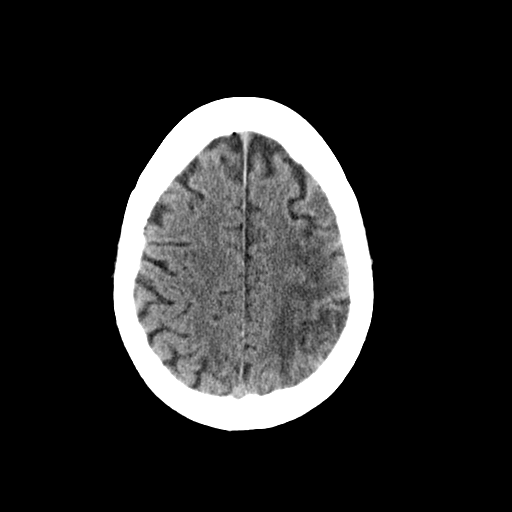
[im 24/32  bone]
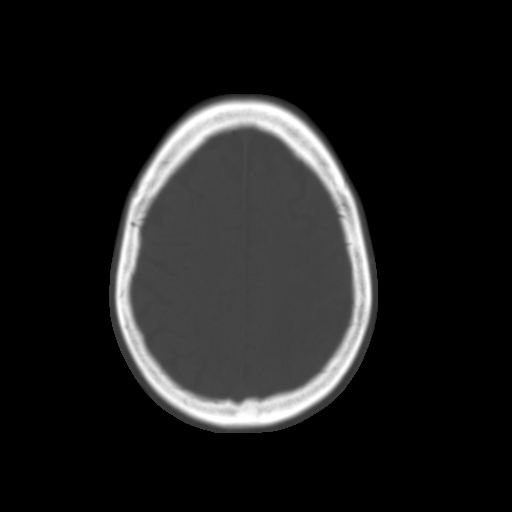
[im 26/32  brain]
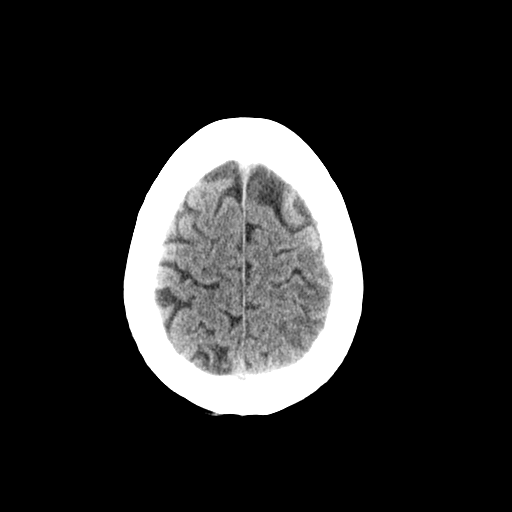
[im 28/32  brain]
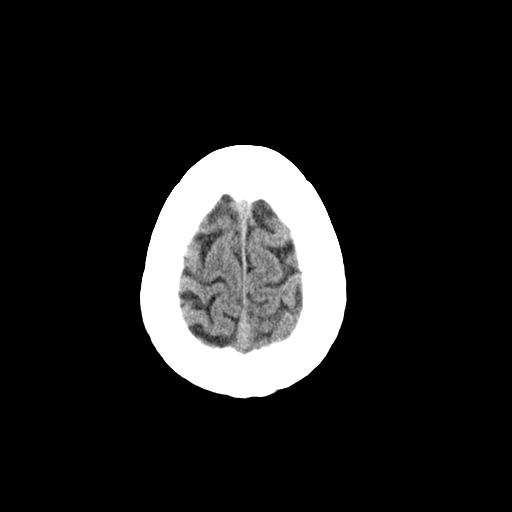
[im 30/32  brain]
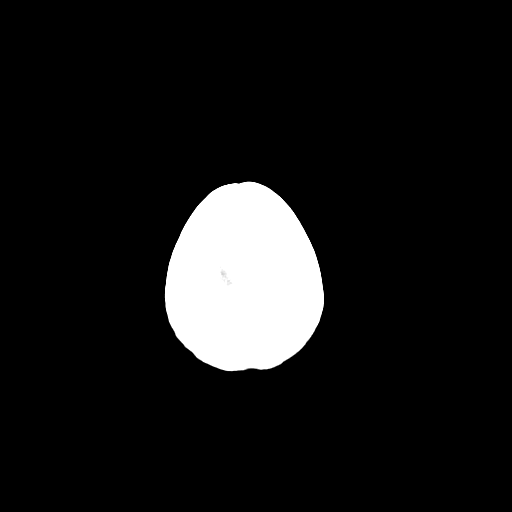

[16 of 30 positions shown; findings below may reference images not displayed]

FINDINGS: No evidence of acute intracranial abnormality including hemorrhage,
infarct, mass lesion, mass effect, midline shift or abnormal
extra-axial fluid collection is identified. There has been
evolutionary change in the left MCA territory infarct seen on the
prior CT. Imaged paranasal sinuses and mastoid air cells are clear.
The calvarium is intact.
IMPRESSION: No acute abnormality.

Left MCA territory infarct as seen on prior examination.
